# Patient Record
Sex: Male | Born: 1980 | Race: Black or African American | Hispanic: No | Marital: Single
Health system: Midwestern US, Community
[De-identification: ages and names within clinical notes are randomized; demographics above are authoritative.]

## PROBLEM LIST (undated history)

## (undated) DIAGNOSIS — R079 Chest pain, unspecified: Secondary | ICD-10-CM

## (undated) DIAGNOSIS — I10 Essential (primary) hypertension: Secondary | ICD-10-CM

## (undated) DIAGNOSIS — J309 Allergic rhinitis, unspecified: Secondary | ICD-10-CM

## (undated) DIAGNOSIS — L74519 Primary focal hyperhidrosis, unspecified: Secondary | ICD-10-CM

## (undated) DIAGNOSIS — J3489 Other specified disorders of nose and nasal sinuses: Secondary | ICD-10-CM

## (undated) DIAGNOSIS — K649 Unspecified hemorrhoids: Secondary | ICD-10-CM

## (undated) DIAGNOSIS — B009 Herpesviral infection, unspecified: Secondary | ICD-10-CM

## (undated) DIAGNOSIS — R3129 Other microscopic hematuria: Secondary | ICD-10-CM

## (undated) DIAGNOSIS — A539 Syphilis, unspecified: Secondary | ICD-10-CM

## (undated) DIAGNOSIS — Z01818 Encounter for other preprocedural examination: Secondary | ICD-10-CM

## (undated) HISTORY — DX: Chest pain, unspecified: R07.9

## (undated) MED ORDER — TRIAMCINOLONE ACETONIDE 0.5 % TOPICAL CREAM
0.5 % | Freq: Two times a day (BID) | CUTANEOUS | Status: DC
Start: ? — End: 2014-02-11

## (undated) MED ORDER — LORATADINE 10 MG TAB
10 mg | ORAL_TABLET | Freq: Every day | ORAL | Status: DC
Start: ? — End: 2014-02-11

## (undated) MED ORDER — LISINOPRIL-HYDROCHLOROTHIAZIDE 20 MG-12.5 MG TAB
ORAL_TABLET | Freq: Two times a day (BID) | ORAL | Status: DC
Start: ? — End: 2012-07-17

## (undated) MED ORDER — AZITHROMYCIN 500 MG TAB
500 mg | ORAL_TABLET | Freq: Every day | ORAL | Status: AC
Start: ? — End: 2012-07-18

## (undated) MED ORDER — LORATADINE 10 MG TAB
10 mg | ORAL_TABLET | Freq: Every day | ORAL | Status: DC
Start: ? — End: 2012-07-17

---

## 2010-12-26 MED ORDER — TRIAMCINOLONE ACETONIDE 0.5 % TOPICAL CREAM
0.5 % | Freq: Two times a day (BID) | CUTANEOUS | Status: DC
Start: 2010-12-26 — End: 2012-07-17

## 2010-12-26 MED ORDER — LISINOPRIL-HYDROCHLOROTHIAZIDE 20 MG-25 MG TAB
20-25 mg | ORAL_TABLET | Freq: Every day | ORAL | Status: DC
Start: 2010-12-26 — End: 2011-02-21

## 2010-12-26 NOTE — Progress Notes (Signed)
Mr. Delpilar presents today as a new patient.  He was previously followed by Dr. Lillia Corporal in [place], [place].  The patient reports having been seen approximately six months ago for upper respiratory illness, which was treated with, he believes, some antibiotics.  Since then, he has been taking some Mucinex over-the-counter but complains today of some persistent cough on and off since then.  The cough is unassociated with any sputum production or fever.  He denies any chest pain or shortness of breath.      The patient also complains of a rash to the neck as well as the posterior leg area.  He reports a childhood history of eczema but has not had this rash evaluated or treated recently.  He does report some associated itching.      Current medical problems include allergic rhinitis and gastroesophageal reflux.  He reports compliance with his medications.  However, he does report having been off of his blood pressure medications over the past three months as he believes he no longer needed the medication after having lost a significant amount of weight.      Review of systems is positive for some headaches.  He denies any sinus pain, chest pain, shortness of breath, abdominal pain, nausea, vomiting, fatigue or fevers.  No focal neurologic symptoms.    MedDATA/leh       Past Medical History   Diagnosis Date   ??? Hemorrhoids 07-2010   ??? GERD (gastroesophageal reflux disease)    ??? Hypertension    ??? Headache 2010   ??? Contact dermatitis and other eczema, due to unspecified cause    ??? Chronic allergic rhinitis      Family History   Problem Relation Age of Onset   ??? Headache Mother    ??? Hypertension Mother    ??? Heart Disease Father    ??? Hypertension Father    ??? Heart Attack Father    ??? Heart Disease Paternal Grandfather        History   Substance Use Topics   ??? Smoking status: Never Smoker    ??? Smokeless tobacco: Never Used   ??? Alcohol Use: Yes      rare      BP 158/76   Pulse 78   Temp(Src) 97.8 ??F (36.6 ??C) (Oral)   Resp 20   Ht 5' 6.5" (1.689 m)   Wt 174 lb (78.926 kg)   BMI 27.66 kg/m2    .Well nourished, not distressed  HEENT: no mucosal edema/inflammation  Neck: Supple without lymphadenopathy.   Lungs: CTA bilaterally with normal effort  CV: nl S1S2 no audible M/G/R; no pedal edema  Ab: non-distended, non-tender   derm: large hyperpigmented patches at lateral posterior base of neck and popliteal fossa  Moderate hyperpigmented macules <0.5 cm of the the back    Alert and oriented CN II-XII intact; no focal deficits   Nl affect and behavior      [place] was seen today for establish care, rash, headache and cough.    Diagnoses and associated orders for this visit:    Eczema  - triamcinolone (ARISTOCORT) 0.5 % topical cream; Apply  to affected area two (2) times a day. use thin layer  - REFERRAL TO DERMATOLOGY    Chronic cough  - XR CHEST PA LAT; Future  - SPECIMEN HANDLING,DR OFF->LAB  - COLLECTION VENOUS BLOOD,VENIPUNCTURE  - CBC WITH AUTOMATED DIFF  - HIV 1/O/2 AB WITH CONFIRMATION    Allergic rhinitis, cause unspecified,  The current medical regimen is effective;  continue present plan and medications.     Gerd (gastroesophageal reflux disease), The current medical regimen is effective;  continue present plan and medications.     Essential hypertension, uncontrolled off medicaiton  - Resume lisinopril-hydrochlorothiazide (PRINZIDE, ZESTORETIC) 20-25 mg per tablet; Take 1 Tab by mouth daily. Indications: HYPERTENSION  - METABOLIC PANEL, BASIC

## 2010-12-26 NOTE — Patient Instructions (Addendum)
Chest X-Rays: About These Tests  What is it?  A chest X-ray is a picture of the chest that shows your heart, lungs, airway, blood vessels, and lymph nodes. Chest X-rays can also show the bones of your spine and chest.  Why is this test done?  A chest X-ray is done to find problems with the organs and structures inside the chest.  How can you prepare for the test?  ?? Tell your doctor if you are or might be pregnant. A chest X-ray is usually not done during pregnancy, but the chance of harm to the fetus is very small. If you need a chest X-ray, you will wear a lead apron to help protect your baby.   What happens before the test?  ?? Remove any jewelry that might get in the way of the X-ray picture.   ?? You may need to take off all or most of your clothes above the waist. You will be given a gown to wear during the test.   What happens during the test?  Two X-ray views of the chest are usually taken. One view is taken from the back. The other view is taken from the side.  ?? You stand with your chest against an X-ray plate for the pictures.   ?? You will need to hold very still while the X-ray is taken. You may be asked to hold your breath for a few seconds.   What else should you know about the test?  ?? You won't feel any pain from the chest X-ray itself.   ?? If you have pain from a chest problem, you may feel some discomfort if you need to hold a certain position, breathe deep, or hold your breath while the X-ray is done.   How long does the test take?  ?? The test will take about 10 minutes.   What happens after the test?  ?? You will probably be able to go home right away. The results of a chest X-ray are usually available in 1 to 2 days.   ?? You can go back to your usual activities right away.   When should you call for help?  Watch closely for changes in your health, and be sure to contact your doctor if you have any questions about the test.   Follow-up care is a key part of your treatment and safety. Be sure to make and go to all appointments, and call your doctor if you are having problems. It's also a good idea to keep a list of the medicines you take. Ask your doctor when you can expect to have your test results.    Where can you learn more?    Go to [facility]   Enter [phone] in the search box to learn more about "Chest X-Rays: About These Tests."    ?? 2006-2012 [employer]. Care instructions adapted under license by [employer] (which disclaims liability or warranty for this information). This care instruction is for use with your licensed healthcare professional. If you have questions about a medical condition or this instruction, always ask your healthcare professional. [employer], Incorporated disclaims any warranty or liability for your use of this information.  Content Version: 9.2.102713; Last Revised: April 26, 2009    HIV Testing: After Your Visit  Your Care Instructions  You can get tested for the human immunodeficiency virus (HIV). This test checks for HIV antibodies in the blood. If HIV antibodies are found, the test is considered positive. If  HIV antibodies are not found, you may need to be tested again. This is done to make sure HIV antibodies do not appear at a later time, because it can take up to 6 months for antibodies to develop after a person is first exposed to the virus. Testing is often done at 6 weeks, 3 months, and 6 months after exposure. During this period, an infected person can still spread the infection even though his or her test was negative.  After initial testing, it is important for your doctor to contact you with the results of your test. Be sure to tell your doctor how and where to contact you. If your doctor has not contacted you within 1 to 2 weeks after your test, call and ask for your results.   Follow-up care is a key part of your treatment and safety. Be sure to make and go to all appointments, and call your doctor if you are having problems. It???s also a good idea to know your test results and keep a list of the medicines you take.  What do the results mean?  Regardless of the outcome of your test, your doctor may ask you to return to discuss your results. This does not mean that you have HIV.  Normal result  ?? A normal result means that no HIV antibodies were found in your blood. Normal results are called negative.   ?? Since most infected people have antibodies to HIV within 6 months after their exposure, you may need a repeat test to be sure the results are correct. If a repeat test at 6 months after your last exposure is negative, there is no infection.   Indeterminate result  ?? A test result that does not clearly show whether you have an HIV infection is usually called an indeterminate result. It may happen before HIV antibodies develop or when some other type of antibody is interfering with the results. If this occurs, another test will usually be done right away.   Abnormal result  ?? An abnormal result means that you have HIV antibodies in your blood. These results are called positive.   ?? If your test result is positive, you will get counseling on how to handle the results and what to do next.   ?? A positive test is repeated on the same blood sample. If two or more results are positive, they must be confirmed by another type of test. This is because some tests can cause false-positive results. No one is considered HIV-positive until he or she has a positive Western blot, IFA, or PCR test, which are used to confirm other test results.    ?? If you have a positive test result, contact your sex partners to tell them. They should be tested. You may be able to get help from your local Health Department in contacting your sex partners. In many places, a Health Department employee will contact you to offer this help.     Where can you learn more?    Go to [facility]   Enter [phone] in the search box to learn more about "HIV Testing: After Your Visit."    ?? 2006-2012 Healthwise, Inc[employer]. Care instructions adapted under license by [employer] (which disclaims liability or warranty for this information). This care instruction is for use with your licensed healthcare professional. If you have questions about a medical condition or this instruction, always ask your healthcare professional. Healthwise, Incorporated disclaims any warranty or liability for your use of this  information.  Content Version: 9.2.102713; Last Revised: September 16, 2008

## 2010-12-27 LAB — HIV 1/O/2 AB WITH CONFIRMATION
HIV 1/O/2 Abs, QL: NONREACTIVE
HIV 1/O/2 Abs, Qual: NONREACTIVE
HIV 1/O/2 Abs-Index Value: <1 (ref <1.00)
HIV 1/O/2 Abs: <1 (ref <1.00)

## 2010-12-27 LAB — CBC WITH AUTOMATED DIFF
ABS. BASOPHILS: 0 10*3/uL (ref 0.0–0.2)
ABS. EOSINOPHILS: 0.2 10*3/uL (ref 0.0–0.4)
ABS. IMM. GRANS.: 0 10*3/uL (ref 0.0–0.1)
ABS. LYMPHOCYTES: 1.5 10*3/uL (ref 0.7–4.5)
ABS. MONOCYTES: 0.4 10*3/uL (ref 0.1–1.0)
ABS. NEUTROPHILS: 6.2 10*3/uL (ref 1.8–7.8)
BASOPHILS: 0 % (ref 0–3)
EOSINOPHILS: 3 % (ref 0–7)
HCT: 42.3 % (ref 36.0–50.0)
HGB: 13.9 g/dL (ref 12.5–17.0)
IMMATURE GRANULOCYTES: 0 % (ref 0–2)
LYMPHOCYTES: 18 % (ref 14–46)
MCH: 29.3 pg (ref 27.0–34.0)
MCHC: 32.9 g/dL (ref 32.0–36.0)
MCV: 89 fL (ref 80–98)
MONOCYTES: 5 % (ref 4–13)
NEUTROPHILS: 74 % (ref 40–74)
PLATELET: 348 10*3/uL (ref 140–415)
RBC: 4.74 x10E6/uL (ref 4.10–5.60)
RDW: 12.9 % (ref 11.7–15.0)
WBC: 8.4 10*3/uL (ref 4.0–10.5)

## 2011-02-21 ENCOUNTER — Encounter

## 2011-02-21 MED ORDER — LISINOPRIL-HYDROCHLOROTHIAZIDE 20 MG-25 MG TAB
20-25 mg | ORAL_TABLET | Freq: Every day | ORAL | Status: DC
Start: 2011-02-21 — End: 2011-03-21

## 2011-02-21 NOTE — Telephone Encounter (Signed)
Message copied by Farrel Gobble on Tue Feb 21, 2011  3:03 PM  ------       Message from: Vangie Bicker       Created: Tue Feb 21, 2011  2:26 PM       Regarding: hill         #[phone] refill on Lisinopril but states he has to speak with you first [place].  Pt states he moved. (?)

## 2011-03-21 ENCOUNTER — Encounter

## 2011-03-21 MED ORDER — LISINOPRIL-HYDROCHLOROTHIAZIDE 20 MG-25 MG TAB
20-25 mg | ORAL_TABLET | Freq: Every day | ORAL | Status: DC
Start: 2011-03-21 — End: 2011-08-25

## 2011-03-21 MED ORDER — RANITIDINE 150 MG TAB
150 mg | ORAL_TABLET | Freq: Every day | ORAL | Status: DC
Start: 2011-03-21 — End: 2011-08-25

## 2011-03-21 MED ORDER — LORATADINE-PSEUDOEPHEDRINE SR 10 MG-240 MG 24 HR TAB
10-240 mg | ORAL_TABLET | Freq: Every day | ORAL | Status: DC
Start: 2011-03-21 — End: 2011-07-18

## 2011-03-21 NOTE — Telephone Encounter (Signed)
Pt only stated he needed bp, allergy and stomach medications refilled.  I believe I have the correct ones. He didn't know names off hand.      PLEASE USE [facility] [address]

## 2011-07-18 ENCOUNTER — Encounter

## 2011-07-18 MED ORDER — LORATADINE-PSEUDOEPHEDRINE SR 10 MG-240 MG 24 HR TAB
10-240 mg | ORAL_TABLET | Freq: Every day | ORAL | Status: DC
Start: 2011-07-18 — End: 2014-02-11

## 2011-07-18 NOTE — Telephone Encounter (Signed)
ML please call prior to rx rf.

## 2011-07-18 NOTE — Telephone Encounter (Signed)
Pt transferred to set up appt with Dr. Hill.    Signed and sent to pharmacy per verbal order Dr. Hill.

## 2011-08-25 MED ORDER — RANITIDINE 150 MG TAB
150 mg | ORAL_TABLET | Freq: Every day | ORAL | Status: DC | PRN
Start: 2011-08-25 — End: 2012-07-17

## 2011-08-25 MED ORDER — LORATADINE 10 MG TAB
10 mg | ORAL_TABLET | Freq: Every day | ORAL | Status: DC
Start: 2011-08-25 — End: 2012-05-22

## 2011-08-25 MED ORDER — LISINOPRIL-HYDROCHLOROTHIAZIDE 20 MG-12.5 MG TAB
ORAL_TABLET | Freq: Two times a day (BID) | ORAL | Status: DC
Start: 2011-08-25 — End: 2012-05-22

## 2011-08-25 MED ORDER — FLUTICASONE 50 MCG/ACTUATION NASAL SPRAY, SUSP
50 mcg/actuation | Freq: Two times a day (BID) | NASAL | Status: DC | PRN
Start: 2011-08-25 — End: 2014-02-11

## 2011-08-25 NOTE — Progress Notes (Signed)
Levi [name] presents today in follow-up of chronic medical problems including hypertension, allergic rhinitis, and gastroesophageal reflux.  He reports _____ sinus symptoms but only occasional reflux symptoms.  He reports compliance with his blood pressure medication and he denies any heavy alcohol or caffeine intake.  He does not exercise regularly but is making plans to do so.  He reports a significant amount of weight gain this year however he denies any heat or cold intolerance, any fatigue or constipation.    Review of systems is otherwise negative for any headaches, dizziness, any shortness of breath, any abdominal symptoms, leg swelling.      MedDATA/jah       Past Medical History   Diagnosis Date   ??? Hemorrhoids 07-2010   ??? GERD (gastroesophageal reflux disease)    ??? Hypertension    ??? Headache 2010   ??? Contact dermatitis and other eczema, due to unspecified cause    ??? Chronic allergic rhinitis      History   Substance Use Topics   ??? Smoking status: Never Smoker    ??? Smokeless tobacco: Never Used   ??? Alcohol Use: Yes      rare     Current Outpatient Prescriptions on File Prior to Visit   Medication Sig Dispense Refill   ??? loratadine-pseudoephedrine (CLARITIN-D 24-HOUR) 10-240 mg per tablet Take 1 Tab by mouth daily. Indications: ALLERGIC RHINITIS  30 Tab  0   ??? DISCONTD: lisinopril-hydrochlorothiazide (PRINZIDE, ZESTORETIC) 20-25 mg per tablet Take 1 Tab by mouth daily. Indications: HYPERTENSION  30 Tab  1   ??? triamcinolone (ARISTOCORT) 0.5 % topical cream Apply  to affected area two (2) times a day. use thin layer  60 g  1     BP 122/98   Pulse 85   Resp 18   Ht 5' 6.5" (1.689 m)   Wt 194 lb (87.998 kg)   BMI 30.84 kg/m2   SpO2 99%  Well nourished, not distressed  HEENT: no mucosal edema/inflammation  Neck: No carotid bruit No thyromegaly   >2 patellar dtr's   Lungs: CTA bilaterally with normal effort  CV: nl S1S2 no audible M/G/R;   No BLE edema  Ab: non-distended, non-tender      April normal cbc     Levi [name] was seen today for medication refill.    Diagnoses and associated orders for this visit:    Hypertension, uncontrolled  - PR SPECIMEN HANDLING,DR OFF->LAB  - PR COLLECTION VENOUS BLOOD,VENIPUNCTURE  - METABOLIC PANEL, BASIC  - TSH, 3RD GENERATION  - Increase lisinopril-hydrochlorothiazide (PRINZIDE, ZESTORETIC) 20-12.5 mg per tablet; Take 1 Tab by mouth two (2) times a day.  - LIPID PANEL    Gerd (gastroesophageal reflux disease)  - wean to prn: ranitidine (ZANTAC) 150 mg tablet; Take 1 Tab by mouth daily as needed for Indigestion. Indications: GASTROESOPHAGEAL REFLUX    Chronic allergic rhinitis, The current medical regimen is effective;  continue present plan and medications.   - loratadine (CLARITIN) 10 mg tablet; Take 1 Tab by mouth daily.  - fluticasone (FLONASE) 50 mcg/actuation nasal spray; 1 Spray by Both Nostrils route two (2) times daily as needed for Rhinitis. Indications: CHRONIC NON-ALLERGIC RHINITIS    Weight gain  - TSH, 3RD GENERATION  - Recommended lifestyle and dietary modifications and reviewed benefits of exercise 3-4 x weekly for at least one half hour and weight loss.

## 2011-08-25 NOTE — Progress Notes (Signed)
Chief Complaint   Patient presents with   ??? Medication Refill     "REVIEWED RECORD IN PREPARATION FOR VISIT AND HAVE OBTAINED THE NECESSARY DOCUMENTATION"    Patient refuses flu shot.

## 2011-08-26 LAB — METABOLIC PANEL, BASIC
BUN/Creatinine ratio: 11 (ref 8–19)
BUN: 12 mg/dL (ref 6–20)
CO2: 24 mmol/L (ref 20–32)
Calcium: 9.9 mg/dL (ref 8.7–10.2)
Chloride: 101 mmol/L (ref 97–108)
Creatinine: 1.11 mg/dL (ref 0.76–1.27)
GFR est non-AA: 87 mL/min/{1.73_m2} (ref >59)
Glucose: 86 mg/dL (ref 65–99)
Potassium: 4.4 mmol/L (ref 3.5–5.2)
Sodium: 141 mmol/L (ref 134–144)
eGFR If African American: 101 mL/min/{1.73_m2} (ref >59)

## 2011-08-26 LAB — LIPID PANEL
Cholesterol, total: 207 mg/dL — ABNORMAL HIGH (ref 100–199)
HDL Cholesterol: 39 mg/dL — ABNORMAL LOW (ref >39)
LDL, calculated: 148 mg/dL — ABNORMAL HIGH (ref 0–99)
Triglyceride: 101 mg/dL (ref 0–149)
VLDL, calculated: 20 mg/dL (ref 5–40)

## 2011-08-26 LAB — CVD REPORT: PDF IMAGE: 0

## 2011-08-26 LAB — TSH 3RD GENERATION: TSH: 1.85 u[IU]/mL (ref 0.450–4.500)

## 2011-10-02 NOTE — Progress Notes (Signed)
Chief Complaint   Patient presents with   ??? Hospital Follow Up     pt. was seen at [facility] er and would like to follow up on asthma and low bp     "Reviewed record in preparation for visit and have obtained the necessary documentation."

## 2011-10-02 NOTE — Progress Notes (Signed)
Levi [name] presents today after follow-up from the emergency department yesterday.  After being diagnosed with upper respiratory illness and dehydration he was treated with a Z-Pak, Hydrocodone for pain, Phenergan for nausea and Motrin.  He reports having been given a breathing treatment as well.  He was evaluated and treated at [facility] Emergency Department.  He reports that on Saturday, two days ago he developed some cough with yellow phlegm that has been causing some back achiness but he denies any general body aches.  He reports some decreased sleep, feeling tired, initial sore throat which has resolved.  He denies any dizziness, any vomiting.  He does report decreased appetite and decreased oral intake but is tolerating the liquids.  He denies any abdominal pain or diarrhea.  He denies any chest pain, any wheezing or shortness of breath.  He has no previous history of asthma.      MedDATA/jah       Past Medical History   Diagnosis Date   ??? Hemorrhoids 07-2010   ??? GERD (gastroesophageal reflux disease)    ??? Hypertension    ??? Headache 2010   ??? Contact dermatitis and other eczema, due to unspecified cause    ??? Chronic allergic rhinitis    ??? Asthma      History   Substance Use Topics   ??? Smoking status: Never Smoker    ??? Smokeless tobacco: Never Used   ??? Alcohol Use: Yes      rare     Current Outpatient Prescriptions on File Prior to Visit   Medication Sig Dispense Refill   ??? loratadine (CLARITIN) 10 mg tablet Take 1 Tab by mouth daily.  30 Tab  3   ??? ranitidine (ZANTAC) 150 mg tablet Take 1 Tab by mouth daily as needed for Indigestion. Indications: GASTROESOPHAGEAL REFLUX  30 Tab  3   ??? lisinopril-hydrochlorothiazide (PRINZIDE, ZESTORETIC) 20-12.5 mg per tablet Take 1 Tab by mouth two (2) times a day.  60 Tab  3   ??? loratadine-pseudoephedrine (CLARITIN-D 24-HOUR) 10-240 mg per tablet Take 1 Tab by mouth daily. Indications: ALLERGIC RHINITIS  30 Tab  0   ??? fluticasone (FLONASE) 50 mcg/actuation nasal spray 1  Spray by Both Nostrils route two (2) times daily as needed for Rhinitis. Indications: CHRONIC NON-ALLERGIC RHINITIS  1 Bottle  5   ??? triamcinolone (ARISTOCORT) 0.5 % topical cream Apply  to affected area two (2) times a day. use thin layer  60 g  1     BP 124/90   Pulse 94   Temp(Src) 98.5 ??F (36.9 ??C) (Oral)   Resp 16   Ht 5' 6.5" (1.689 m)   Wt 193 lb 12.8 oz (87.907 kg)   BMI 30.81 kg/m2   SpO2 98%  Well nourished, not distressed; appears tired  Cough noted  HEENT: mild nasal mucosal edema/inflammation;   no tonsillar exudates  Neck: Supple without lymphadenopathy.   Lungs: CTA bilaterally with normal effort  no wheezing/rales/ or rhonchi   CV: nl S1S2           [place] was seen today for hospital follow up.    Diagnoses and associated orders for this visit:    Acute uri, continue current regimen as detailed above  Recommended symptomatic treatment, increased fluids and rest. RTC or call if symptoms worsen or persist.   recommended Muccinex DM or equivalent for cough and chest congestion prn     Sore throat  - AMB POC RAPID INFLUENZA TEST--neg  -  OTC NSAID prn with food

## 2011-10-03 LAB — AMB POC RAPID INFLUENZA TEST: QuickVue Influenza test: NEGATIVE

## 2012-05-22 ENCOUNTER — Encounter

## 2012-05-22 NOTE — Telephone Encounter (Signed)
From: Eisenhuth,Jamorris   To: Inez Pilgrim, MD   Sent: Wed May 22, 2012 11:34 AM   Subject: Medication Renewal Request    Original authorizing provider: Inez Pilgrim, MD    Levi [name] would like a refill of the following medications:  loratadine (CLARITIN) 10 mg tablet Inez Pilgrim, MD]  lisinopril-hydrochlorothiazide (PRINZIDE, ZESTORETIC) 20-12.5 mg per tablet Inez Pilgrim, MD]    Preferred pharmacy: [address] [place] [address] [facility]    Comment:  Can you have sent [facility] pharmacy [facility] is [facility]  [address] [place] [address]

## 2012-05-24 NOTE — Telephone Encounter (Signed)
rx's phoned into [facility]

## 2012-07-17 LAB — AMB POC URINALYSIS DIP STICK AUTO W/O MICRO
Bilirubin (UA POC): NEGATIVE
Glucose (UA POC): NEGATIVE
Ketones (UA POC): NEGATIVE
Leukocyte esterase (UA POC): NEGATIVE
Nitrites (UA POC): NEGATIVE
Specific gravity (UA POC): 1.02 (ref 1.001–1.035)
Urobilinogen (UA POC): 0.2 (ref 0.2–1)
pH (UA POC): 7 (ref 4.6–8.0)

## 2012-07-17 NOTE — Progress Notes (Signed)
Chief Complaint   Patient presents with   ??? Blood Pressure Check   ??? Other     HIV/STD      "REVIEWED RECORD IN PREPARATION FOR VISIT AND HAVE OBTAINED THE NECESSARY DOCUMENTATION"    Component      Latest Ref Rng 10/02/2011 08/25/2011 08/25/2011 08/25/2011           8:02 PM 11:23 AM 11:23 AM 11:23 AM   WBC      4.0 - 10.5 x10E3/uL       RBC      4.10 - 5.60 x10E6/uL       HGB      12.5 - 17.0 g/dL       HCT      16.1 - 09.6 %       MCV      80 - 98 fL       MCH      27.0 - 34.0 pg       MCHC      32.0 - 36.0 g/dL       RDW      04.5 - 40.9 %       PLATELET      140 - 415 x10E3/uL       NEUTROPHILS      40 - 74 %       LYMPHOCYTES      14 - 46 %       MONOCYTES      4 - 13 %       EOSINOPHILS      0 - 7 %       BASOPHILS      0 - 3 %       ABS. NEUTROPHILS      1.8 - 7.8 x10E3/uL       ABS. LYMPHOCYTES      0.7 - 4.5 x10E3/uL       ABS. MONOCYTES      0.1 - 1.0 x10E3/uL       ABS. EOSINOPHILS      0.0 - 0.4 x10E3/uL       ABS. BASOPHILS      0.0 - 0.2 x10E3/uL       IMMATURE GRANULOCYTES      0 - 2 %       ABS. IMM. GRANS.      0.0 - 0.1 x10E3/uL       Glucose      65 - 99 mg/dL  86     BUN      6 - 20 mg/dL  12     Creatinine      0.76 - 1.27 mg/dL  8.11     GFR est non-AA      >59 mL/min/1.73  87     eGFR If Africn Am      >59 mL/min/1.73  101     BUN/Creatinine ratio      8 - 19  11     Sodium      134 - 144 mmol/L  141     Potassium      3.5 - 5.2 mmol/L  4.4     Chloride      97 - 108 mmol/L  101     CO2      20 - 32 mmol/L  24     Calcium      8.7 - 10.2 mg/dL  9.9     Cholesterol, total  100 - 199 mg/dL    161 (H)   Triglyceride      0 - 149 mg/dL    096   HDL Cholesterol      >39 mg/dL    39 (L)   VLDL, calculated      5 - 40 mg/dL    20   LDL, calculated      0 - 99 mg/dL    045 (H)   INTERPRETATION             PDF IMAGE             TSH      0.450 - 4.500 uIU/mL   1.850    QuickVue Influenza test      Negative Negative        Component      Latest Ref Rng 08/25/2011 12/26/2010          11:23 AM 12:38 PM    WBC      4.0 - 10.5 x10E3/uL  8.4   RBC      4.10 - 5.60 x10E6/uL  4.74   HGB      12.5 - 17.0 g/dL  40.9   HCT      81.1 - 50.0 %  42.3   MCV      80 - 98 fL  89   MCH      27.0 - 34.0 pg  29.3   MCHC      32.0 - 36.0 g/dL  91.4   RDW      78.2 - 15.0 %  12.9   PLATELET      140 - 415 x10E3/uL  348   NEUTROPHILS      40 - 74 %  74   LYMPHOCYTES      14 - 46 %  18   MONOCYTES      4 - 13 %  5   EOSINOPHILS      0 - 7 %  3   BASOPHILS      0 - 3 %  0   ABS. NEUTROPHILS      1.8 - 7.8 x10E3/uL  6.2   ABS. LYMPHOCYTES      0.7 - 4.5 x10E3/uL  1.5   ABS. MONOCYTES      0.1 - 1.0 x10E3/uL  0.4   ABS. EOSINOPHILS      0.0 - 0.4 x10E3/uL  0.2   ABS. BASOPHILS      0.0 - 0.2 x10E3/uL  0.0   IMMATURE GRANULOCYTES      0 - 2 %  0   ABS. IMM. GRANS.      0.0 - 0.1 x10E3/uL  0.0   Glucose      65 - 99 mg/dL     BUN      6 - 20 mg/dL     Creatinine      9.56 - 1.27 mg/dL     GFR est non-AA      >59 mL/min/1.73     eGFR If Africn Am      >59 mL/min/1.73     BUN/Creatinine ratio      8 - 19     Sodium      134 - 144 mmol/L     Potassium      3.5 - 5.2 mmol/L     Chloride      97 - 108 mmol/L     CO2  20 - 32 mmol/L     Calcium      8.7 - 10.2 mg/dL     Cholesterol, total      100 - 199 mg/dL     Triglyceride      0 - 149 mg/dL     HDL Cholesterol      >39 mg/dL     VLDL, calculated      5 - 40 mg/dL     LDL, calculated      0 - 99 mg/dL     INTERPRETATION       Note    PDF IMAGE       .    TSH      0.450 - 4.500 uIU/mL     QuickVue Influenza test      Negative

## 2012-07-17 NOTE — Progress Notes (Signed)
Levi [name] presents today for evaluation of his probably and with complaints of a penile discharge.  He has a history of.hypertension that has been treated with medication, however, he has only been taking medication sparingly.  He reports a 30 pound weight loss intentionally through dieting and exercise over the past several months and did not feel that he needed to take the blood pressure medication.  He also reports that hs reflux has been controlled without his Zantac.      He reports also to have a penile pussy white discharge approximately two weeks ago that self resolved after a week.  He has a previous history of STD, which he cannot recall the exact diagnosis of, but one that he was treated for years ago.  The patient current reports a single sexual partner over the past six months.  He does have a history of bisexual activity.  At this time he reports that he has not had any recurrent discharge after it resolved after a week, and he denies any current dysuria, hematuria, back pain, abdominal pain, nausea, vomiting or fever.  He also denies any penile rashes, lesions or any swelling.  He denies any fever, joint pain or fatigue.     MedDATA/jtm      Past Medical History   Diagnosis Date   ??? Hemorrhoids 07-2010   ??? GERD (gastroesophageal reflux disease)    ??? Hypertension    ??? Headache 2010   ??? Contact dermatitis and other eczema, due to unspecified cause    ??? Chronic allergic rhinitis    ??? Asthma      History   Substance Use Topics   ??? Smoking status: Never Smoker    ??? Smokeless tobacco: Never Used   ??? Alcohol Use: Yes      Comment: rare     Current Outpatient Prescriptions on File Prior to Visit   Medication Sig Dispense Refill   ??? fluticasone (FLONASE) 50 mcg/actuation nasal spray 1 Spray by Both Nostrils route two (2) times daily as needed for Rhinitis. Indications: CHRONIC NON-ALLERGIC RHINITIS  1 Bottle  5   ??? loratadine-pseudoephedrine (CLARITIN-D 24-HOUR) 10-240 mg per tablet Take 1 Tab by mouth  daily. Indications: ALLERGIC RHINITIS  30 Tab  0     No current facility-administered medications on file prior to visit.     BP 122/68   Pulse 94   Temp(Src) 98 ??F (36.7 ??C) (Oral)   Resp 18   Ht 5' 6.5" (1.689 m)   Wt 165 lb (74.844 kg)   BMI 26.24 kg/m2   SpO2 97%  Well nourished, not distressed  Lungs: CTA bilaterally with normal effort  CV: nl S1S2 no audible M/G/R;   Ab: non-distended, non-tender        See attached lab results           Levi [name] was seen today for blood pressure check and other.    Diagnoses and associated orders for this visit:    Penile discharge  - AMB POC URINALYSIS DIP STICK AUTO W/O MICRO--abnl  - CHLAMYDIA/GC NAA, CONFIRMATION  - HIV 1/O/2 AB WITH REFLEX NAT  - azithromycin (ZITHROMAX) 500 mg tablet; Take 2 Tabs by mouth daily for 1 day.  - Counseled regarding STD prevention     Gerd and Hypertension, controlled off meds following 30# loss  - METABOLIC PANEL, BASIC    Eczema  - triamcinolone (ARISTOCORT) 0.5 % topical cream; Apply  to affected area two (2) times a day. use  thin layer    Chronic allergic rhinitis  - loratadine (CLARITIN) 10 mg tablet; Take 1 Tab by mouth daily.    Other Orders  - Cancel: lisinopril-hydrochlorothiazide (PRINZIDE, ZESTORETIC) 20-12.5 mg per tablet; Take 1 Tab by mouth two (2) times a day.  - Cancel: ranitidine (ZANTAC) 150 mg tablet; Take 1 Tab by mouth daily as needed for Indigestion. Indications: GASTROESOPHAGEAL REFLUX

## 2012-07-18 LAB — HIV 1/O/2 AB WITH REFLEX NAT
HIV 1/O/2 ABS, QUAL, 001726: NONREACTIVE
HIV 1/O/2 ABS-INDEX VALUE, 150035: <1 (ref <1.00)
HIV 1/O/2 Abs, Qual: NONREACTIVE
HIV 1/O/2 Abs-Index Value: <1 (ref <1.00)

## 2012-07-18 LAB — METABOLIC PANEL, BASIC
BUN/Creatinine ratio: 7 — ABNORMAL LOW (ref 8–19)
BUN: 8 mg/dL (ref 6–20)
CO2: 27 mmol/L (ref 19–28)
Calcium: 9.8 mg/dL (ref 8.7–10.2)
Chloride: 104 mmol/L (ref 97–108)
Creatinine: 1.07 mg/dL (ref 0.76–1.27)
GFR est AA: 105 mL/min/{1.73_m2} (ref >59)
GFR est non-AA: 91 mL/min/{1.73_m2} (ref >59)
Glucose: 80 mg/dL (ref 65–99)
Potassium: 4 mmol/L (ref 3.5–5.2)
Sodium: 142 mmol/L (ref 134–144)

## 2012-07-19 LAB — CHLAMYDIA/GC NAA, CONFIRMATION
Chlamydia trachomatis, NAA: NEGATIVE
Neisseria gonorrhoeae, NAA: NEGATIVE

## 2012-07-22 NOTE — Telephone Encounter (Addendum)
Message copied by Elon Spanner on Mon Jul 22, 2012  3:42 PM  ------       Message from: Luanne Bras       Created: Mon Jul 22, 2012  2:43 PM       Regarding: Test Results Question       Contact: [phone]         Hello,              I wanted to follow up with my lab work that I had done last week. I still haven't heard back and wanted to check the status and see if they are in yet.  ------sent email that everything came back fine.  Will check and see if Dr Loleta Chance needs to f/u with you.

## 2014-02-09 ENCOUNTER — Encounter

## 2014-02-09 LAB — AMB POC URINALYSIS DIP STICK AUTO W/ MICRO (MICRO RESULTS)
Bacteria (UA POC): 0
Crystals (UA POC): 0
Epithelial cells (UA POC): 0
Glucose (UA POC): NEGATIVE
Leukocyte esterase (UA POC): NEGATIVE
Nitrites (UA POC): NEGATIVE
Other (UA POC): 0
Specific gravity (UA POC): 1.03 (ref 1.001–1.035)
Urobilinogen (UA POC): 0.2 (ref 0.2–1)
WBCs (UA POC): 0
pH (UA POC): 6 (ref 4.6–8.0)

## 2014-02-09 LAB — METABOLIC PANEL, COMPREHENSIVE
A-G Ratio: 1.5 (ref 0.8–1.7)
ALT (SGPT): 21 U/L (ref 16–61)
AST (SGOT): 11 U/L — ABNORMAL LOW (ref 15–37)
Albumin: 4.5 g/dL (ref 3.4–5.0)
Alk. phosphatase: 59 U/L (ref 45–117)
Anion gap: 6 mmol/L (ref 3.0–18)
BUN/Creatinine ratio: 9 — ABNORMAL LOW (ref 12–20)
BUN: 10 MG/DL (ref 7.0–18)
Bilirubin, total: 0.4 MG/DL (ref 0.2–1.0)
CO2: 31 mmol/L (ref 21–32)
Calcium: 9.3 MG/DL (ref 8.5–10.1)
Chloride: 106 mmol/L (ref 100–108)
Creatinine: 1.17 MG/DL (ref 0.6–1.3)
GFR est AA: >60 mL/min/{1.73_m2} (ref >60)
GFR est non-AA: >60 mL/min/{1.73_m2} (ref >60)
Globulin: 3 g/dL (ref 2.0–4.0)
Glucose: 87 mg/dL (ref 74–99)
Potassium: 3.8 mmol/L (ref 3.5–5.5)
Protein, total: 7.5 g/dL (ref 6.4–8.2)
Sodium: 143 mmol/L (ref 136–145)

## 2014-02-09 LAB — CBC W/O DIFF
HCT: 39.6 % (ref 36.0–48.0)
HGB: 13.2 g/dL (ref 13.0–16.0)
MCH: 30.9 PG (ref 24.0–34.0)
MCHC: 33.3 g/dL (ref 31.0–37.0)
MCV: 92.7 FL (ref 74.0–97.0)
MPV: 10.5 FL (ref 9.2–11.8)
PLATELET: 291 10*3/uL (ref 135–420)
RBC: 4.27 M/uL — ABNORMAL LOW (ref 4.70–5.50)
RDW: 12.8 % (ref 11.6–14.5)
WBC: 5.3 10*3/uL (ref 4.6–13.2)

## 2014-02-09 LAB — PROTHROMBIN TIME + INR
INR: 0.9 (ref 0.8–1.2)
Prothrombin time: 12.7 s (ref 11.5–15.2)

## 2014-02-09 LAB — AMB POC PVR, MEAS,POST-VOID RES,US,NON-IMAGING: PVR: 0 cc

## 2014-02-09 LAB — PTT: aPTT: 30.2 s (ref 24.6–37.7)

## 2014-02-09 NOTE — Progress Notes (Signed)
CC: Microscopic hematuria    HPI:       Levi [name] is a 33 y.o. male who was referred by Babs BertinATHERINE P KNIGHT, MD for evaluation of Microscopic hematuria.     The patient was sent for evaluation of microscopic hematuria, first seeing our practice in early June 2015.  [facility] indicated a urinalysis in November 2013 was 1+ blood on dipstick (no microscopic evaluation).  [facility] records showed a urinalysis in May 2014 with 3-5 red cells per high-power field, 1 in January 2015 with 10-20 red cells per high-power field, and one in March 2015 with 5-10 red cells per high-power field.  He had no upper tract imaging studies in the [facility] or [facility].   No gross heme.  A [facility] U/S 01/13/14 showed normal kidneys.    He has no personal or family history of urologic malignancy.           AUA Assessment Score:  AUA Score: 6;    AUA Bother Rating:        Past Medical History   Diagnosis Date   ??? Hemorrhoids 07-2010   ??? GERD (gastroesophageal reflux disease)    ??? Hypertension    ??? Headache(784.0) 2010   ??? Contact dermatitis and other eczema, due to unspecified cause    ??? Chronic allergic rhinitis    ??? Asthma        History reviewed. No pertinent past surgical history.     Family History   Problem Relation Age of Onset   ??? Headache Mother    ??? Hypertension Mother    ??? Heart Disease Father    ??? Hypertension Father    ??? Heart Attack Father    ??? Heart Disease Paternal Grandfather        Current Outpatient Prescriptions   Medication Sig Dispense Refill   ??? COMPLERA tablet        ??? lisinopril-hydrochlorothiazide (PRINZIDE, ZESTORETIC) 20-12.5 mg per tablet        ??? triamcinolone (ARISTOCORT) 0.5 % topical cream Apply  to affected area two (2) times a day. use thin layer  60 g  1   ??? loratadine (CLARITIN) 10 mg tablet Take 1 Tab by mouth daily.  30 Tab  3   ??? fluticasone (FLONASE) 50 mcg/actuation nasal spray 1 Spray by Both Nostrils route two (2) times daily as needed for Rhinitis. Indications:  CHRONIC NON-ALLERGIC RHINITIS  1 Bottle  5   ??? loratadine-pseudoephedrine (CLARITIN-D 24-HOUR) 10-240 mg per tablet Take 1 Tab by mouth daily. Indications: ALLERGIC RHINITIS  30 Tab  0       Allergies:  No Known Allergies      ROS:    Fever: Neg Shortness of Breath: Neg Rash: Neg Nausea/Vomiting: Neg Hearing Difficulty: Neg Back Pain: Neg Blurred Vision: Neg Weakness: Neg Chest Pain: Neg Memory Loss: Neg          PE:    Resp 12   Ht 5' 6.5" (1.689 m)   Wt 165 lb (74.844 kg)   BMI 26.24 kg/m2  Constitutional: WDWN, Pleasant and appropriate affect, No acute distress.    CV:  No peripheral swelling noted  Respiratory: No respiratory distress or difficulties  Abdomen:  No abdominal masses or tenderness.  No CVA tenderness. No hernias noted.   Skin: No jaundice.    Neuro/Psych:  Alert and oriented x 3. Affect appropriate.   Lymphatic:   No enlarged inguinal lymph nodes.  GU Male:    Prostate exam-declined, he prefers to be done under sedation when he has his procedure         LAB:    Results for orders placed in visit on 02/09/14   AMB POC PVR, MEAS,POST-VOID RES,US,NON-IMAGING       Result Value Ref Range    PVR 0     AMB POC URINALYSIS DIP STICK AUTO W/ MICRO (MICRO RESULTS)       Result Value Ref Range    Color (UA POC) Yellow      Clarity (UA POC) Clear      Glucose (UA POC) Negative  Negative    Bilirubin (UA POC) 2+  Negative    Ketones (UA POC) Trace  Negative    Specific gravity (UA POC) 1.030  1.001 - 1.035    Blood (UA POC) 3+  Negative    pH (UA POC) 6.0  4.6 - 8.0    Protein (UA POC) Trace  Negative mg/dL    Urobilinogen (UA POC) 0.2 mg/dL  0.2 - 1    Nitrites (UA POC) Negative  Negative    Leukocyte esterase (UA POC) Negative  Negative    Epithelial cells (UA POC) 0      WBCs (UA POC) 0      RBCs (UA POC) 8-10      Bacteria (UA POC) 0      Crystals (UA POC) 0  Negative    Other (UA POC) 0                      IMP:      1. Microscopic hematuria    2. Nocturia                    SUMMARY STATEMENT AND  PLAN:        The patient has persistent microscopic hematuria.  I recommend cystoscopy to complete his evaluation but given his age and anxiety, he wants it done under anesthesia.  By performing it under anesthesia, he would then give [name] the opportunity to perform retrograde pyelography and we will also do his rectal exam at that time.  I offered him the option of an office cystoscopy but he declined.  He understands the risks and accepts.  We will schedule him for outpatient cystoscopy, retrograde pyelography, fluoroscopy, and rectal exam.  He knows he will need to have somebody drive him home from the procedure.             PLEASE NOTE:   This document has been produced using voice recognition software. Unrecognized errors in transcription may be present    Claudius Sis, MD, F.A.C.S.

## 2014-02-09 NOTE — H&P (View-Only) (Signed)
CC: Microscopic hematuria    HPI:       Levi [name] is a 33 y.o. male who was referred by CATHERINE P KNIGHT, MD for evaluation of Microscopic hematuria.     The patient was sent for evaluation of microscopic hematuria, first seeing our practice in early June 2015.  [place] records indicated a urinalysis in November 2013 was 1+ blood on dipstick (no microscopic evaluation).  [facility] records showed a urinalysis in May 2014 with 3-5 red cells per high-power field, 1 in January 2015 with 10-20 red cells per high-power field, and one in March 2015 with 5-10 red cells per high-power field.  He had no upper tract imaging studies in the [facility] or [place] systems.   No gross heme.  A [facility] U/S 01/13/14 showed normal kidneys.    He has no personal or family history of urologic malignancy.           AUA Assessment Score:  AUA Score: 6;    AUA Bother Rating:        Past Medical History   Diagnosis Date   ??? Hemorrhoids 07-2010   ??? GERD (gastroesophageal reflux disease)    ??? Hypertension    ??? Headache(784.0) 2010   ??? Contact dermatitis and other eczema, due to unspecified cause    ??? Chronic allergic rhinitis    ??? Asthma        History reviewed. No pertinent past surgical history.     Family History   Problem Relation Age of Onset   ??? Headache Mother    ??? Hypertension Mother    ??? Heart Disease Father    ??? Hypertension Father    ??? Heart Attack Father    ??? Heart Disease Paternal Grandfather        Current Outpatient Prescriptions   Medication Sig Dispense Refill   ??? COMPLERA tablet        ??? lisinopril-hydrochlorothiazide (PRINZIDE, ZESTORETIC) 20-12.5 mg per tablet        ??? triamcinolone (ARISTOCORT) 0.5 % topical cream Apply  to affected area two (2) times a day. use thin layer  60 g  1   ??? loratadine (CLARITIN) 10 mg tablet Take 1 Tab by mouth daily.  30 Tab  3   ??? fluticasone (FLONASE) 50 mcg/actuation nasal spray 1 Spray by Both Nostrils route two (2) times daily as needed for Rhinitis. Indications:  CHRONIC NON-ALLERGIC RHINITIS  1 Bottle  5   ??? loratadine-pseudoephedrine (CLARITIN-D 24-HOUR) 10-240 mg per tablet Take 1 Tab by mouth daily. Indications: ALLERGIC RHINITIS  30 Tab  0       Allergies:  No Known Allergies      ROS:    Fever: Neg Shortness of Breath: Neg Rash: Neg Nausea/Vomiting: Neg Hearing Difficulty: Neg Back Pain: Neg Blurred Vision: Neg Weakness: Neg Chest Pain: Neg Memory Loss: Neg          PE:    Resp 12   Ht 5' 6.5" (1.689 m)   Wt 165 lb (74.844 kg)   BMI 26.24 kg/m2  Constitutional: WDWN, Pleasant and appropriate affect, No acute distress.    CV:  No peripheral swelling noted  Respiratory: No respiratory distress or difficulties  Abdomen:  No abdominal masses or tenderness.  No CVA tenderness. No hernias noted.   Skin: No jaundice.    Neuro/Psych:  Alert and oriented x 3. Affect appropriate.   Lymphatic:   No enlarged inguinal lymph nodes.            GU Male:    Prostate exam-declined, he prefers to be done under sedation when he has his procedure         LAB:    Results for orders placed in visit on 02/09/14   AMB POC PVR, MEAS,POST-VOID RES,US,NON-IMAGING       Result Value Ref Range    PVR 0     AMB POC URINALYSIS DIP STICK AUTO W/ MICRO (MICRO RESULTS)       Result Value Ref Range    Color (UA POC) Yellow      Clarity (UA POC) Clear      Glucose (UA POC) Negative  Negative    Bilirubin (UA POC) 2+  Negative    Ketones (UA POC) Trace  Negative    Specific gravity (UA POC) 1.030  1.001 - 1.035    Blood (UA POC) 3+  Negative    pH (UA POC) 6.0  4.6 - 8.0    Protein (UA POC) Trace  Negative mg/dL    Urobilinogen (UA POC) 0.2 mg/dL  0.2 - 1    Nitrites (UA POC) Negative  Negative    Leukocyte esterase (UA POC) Negative  Negative    Epithelial cells (UA POC) 0      WBCs (UA POC) 0      RBCs (UA POC) 8-10      Bacteria (UA POC) 0      Crystals (UA POC) 0  Negative    Other (UA POC) 0                      IMP:      1. Microscopic hematuria    2. Nocturia                    SUMMARY STATEMENT AND  PLAN:        The patient has persistent microscopic hematuria.  I recommend cystoscopy to complete his evaluation but given his age and anxiety, he wants it done under anesthesia.  By performing it under anesthesia, he would then give [name] the opportunity to perform retrograde pyelography and we will also do his rectal exam at that time.  I offered him the option of an office cystoscopy but he declined.  He understands the risks and accepts.  We will schedule him for outpatient cystoscopy, retrograde pyelography, fluoroscopy, and rectal exam.  He knows he will need to have somebody drive him home from the procedure.             PLEASE NOTE:   This document has been produced using voice recognition software. Unrecognized errors in transcription may be present    Claudius Sis, MD, F.A.C.S.

## 2014-02-10 LAB — CULTURE, URINE
Culture result:: NO GROWTH
Culture: NO GROWTH

## 2014-02-10 LAB — EKG, 12 LEAD, INITIAL
Atrial Rate: 80 {beats}/min
Calculated P Axis: 29 degrees
Calculated R Axis: 73 degrees
Calculated T Axis: 59 degrees
P-R Interval: 150 ms
Q-T Interval: 356 ms
QRS Duration: 98 ms
QTC Calculation (Bezet): 410 ms
Ventricular Rate: 80 {beats}/min

## 2014-02-19 ENCOUNTER — Inpatient Hospital Stay: Payer: MEDICAID

## 2014-02-19 LAB — DRUG SCREEN, URINE
AMPHETAMINES: NEGATIVE
BARBITURATES: NEGATIVE
BENZODIAZEPINES: NEGATIVE
COCAINE: NEGATIVE
METHADONE: NEGATIVE
OPIATES: NEGATIVE
PCP(PHENCYCLIDINE): NEGATIVE
THC (TH-CANNABINOL): NEGATIVE

## 2014-02-19 MED ORDER — ACETAMINOPHEN-CODEINE 300 MG-30 MG TAB
300-30 mg | ORAL_TABLET | Freq: Four times a day (QID) | ORAL | Status: DC | PRN
Start: 2014-02-19 — End: 2014-07-24

## 2014-02-19 MED ORDER — CIPROFLOXACIN 500 MG TAB
500 mg | ORAL_TABLET | Freq: Two times a day (BID) | ORAL | Status: AC
Start: 2014-02-19 — End: 2014-02-21

## 2014-02-19 MED ORDER — PHENAZOPYRIDINE 200 MG TAB
200 mg | ORAL_TABLET | Freq: Three times a day (TID) | ORAL | Status: AC | PRN
Start: 2014-02-19 — End: 2014-02-26

## 2014-02-19 MED ADMIN — iothalamate meglumine (CONRAY 60) 60 % contrast solution: @ 17:00:00 | NDC 00019095315

## 2014-02-19 MED ADMIN — lactated ringers infusion: INTRAVENOUS | @ 16:00:00 | NDC 00409795309

## 2014-02-19 MED ADMIN — gentamicin in Saline (Iso-osm) (GARAMYCIN) 80 mg/100 mL IVPB premix 80 mg: INTRAVENOUS | @ 17:00:00 | NDC 00338050348

## 2014-02-19 MED ADMIN — famotidine (PF) (PEPCID) injection 20 mg: INTRAVENOUS | @ 16:00:00 | NDC 00641602201

## 2014-02-19 MED ADMIN — ceFAZolin (ANCEF) 2g IVPB in 50 mL D5W: INTRAVENOUS | @ 17:00:00 | NDC 00264310511

## 2014-02-19 MED FILL — CEFAZOLIN 2 GM/50 ML IN DEXTROSE (ISO-OSMOTIC) IVPB: 2 gram/50 mL | INTRAVENOUS | Qty: 50

## 2014-02-19 MED FILL — LACTATED RINGERS IV: INTRAVENOUS | Qty: 1000

## 2014-02-19 MED FILL — FAMOTIDINE (PF) 20 MG/2 ML IV: 20 mg/2 mL | INTRAVENOUS | Qty: 2

## 2014-02-19 MED FILL — FENTANYL (PF) 100 MCG/2 ML (50 MCG/ML) INTRAVENOUS SYRINGE: 100 mcg/2 mL (50 mcg/mL) | INTRAVENOUS | Qty: 2

## 2014-02-19 MED FILL — CONRAY 60 % INJECTION SOLUTION: 60 % | INTRAMUSCULAR | Qty: 100

## 2014-02-19 MED FILL — GENTAMICIN IN SALINE (ISO-OSMOTIC) 80 MG/100 ML IV PIGGY BACK: 80 mg/100 mL | INTRAVENOUS | Qty: 100

## 2014-02-19 MED FILL — MIDAZOLAM 1 MG/ML IJ SOLN: 1 mg/mL | INTRAMUSCULAR | Qty: 2

## 2014-02-19 NOTE — Op Note (Signed)
OPERATIVE NOTE  02/19/2014, 1:14 PM    Pre Op Dx: Hematuria    Post Op Dx: Same    Procedure: Cystoscopy, bilateral retrograde pyelography with fluoroscopy, DRE     Surgeon: Haislee Corso R Alyn Jurney, MD, M.D    Anaesthesia: General    EBL: none    Drains: none    Complications: None    Specimens: None      OP NOTE:  After the patient was identified by his bracelet, he was brought to the operating room and after anesthesia was given, he was placed in the dorsolithotomy position. His perineum, genitalia, and inner thighs were prepped and draped in usual sterile manner.    Using a 21 French cystoscope, cystoscopy panendoscopy was performed with the following results:    The urethra was normal. The bladder outlet/prostate was short and nonobstructed. The bladder was entered without difficulty and both ureteral orifices were visualized and were of normal morphology and in normal position. The bladder capacity was adequate and there was 1 out of 4 trabeculation with no mucosal lesions identified. The bladder was examined with both 30 and 70?? lenses.    Using a Rutner catheter, bilateral retrograde pyelography was performed. This showed normal upper tracts bilaterally with no evidence of filling defects, stones, obstruction, or masses. Both upper urinary systems drained promptly. There were a few air bubbles injected on the left, but subsequent imaging showed clearance of those.    After full inspection of the bladder, again confirming no intraurethral or intravesical pathology, the cystoscope was removed. A DRE showed a 1+, smooth, symmetric prostate. The patient tolerated the procedure well and was taken to recovery room in stable condition.    PLAN:    The patient will be discharged today to follow up in the office.    Chipper Koudelka R. Doniel Maiello, M.D., F.A.C.S.

## 2014-02-19 NOTE — Interval H&P Note (Signed)
H&P Update:  Levi [name] was seen and examined.  History and physical has been reviewed. There have been no significant clinical changes since the completion of the originally dated History and Physical.    Signed By: Claudius SisLAWRENCE R Celesta Funderburk, MD     February 19, 2014 12:46 PM

## 2014-02-19 NOTE — Other (Signed)
I have reviewed discharge and medication reconciliation instructions with the patient and parent, Alexis GoodellSheila Brown.  The patient and parent verbalized understanding.

## 2014-02-19 NOTE — Other (Signed)
TRANSFER - IN REPORT:    Verbal report received from Nelida MeuseGreg Chastang, RN(name) on Levi [name]  being [facility] for routine post - op      Report consisted of patient???s Situation, Background, Assessment and   Recommendations(SBAR).     Information from the following report(s) SBAR, Procedure Summary, MAR and Recent Results was reviewed with the receiving nurse.    Opportunity for questions and clarification was provided.      Assessment completed upon patient???s arrival to unit and care assumed.

## 2014-02-19 NOTE — Other (Signed)
Patient armband removed and shredded.  Patient discharged via wheelchair.  Patient accompanied home with his mother.

## 2014-02-19 NOTE — Op Note (Signed)
OPERATIVE NOTE  02/19/2014, 1:14 PM    Pre Op Dx: Hematuria    Post Op Dx: Same    Procedure: Cystoscopy, bilateral retrograde pyelography with fluoroscopy, DRE     Surgeon: Claudius SisLAWRENCE R Glennice Marcos, MD, M.D    Anaesthesia: General    EBL: none    Drains: none    Complications: None    Specimens: None      OP NOTE:  After the patient was identified by his bracelet, he was brought to the operating room and after anesthesia was given, he was placed in the dorsolithotomy position. His perineum, genitalia, and inner thighs were prepped and draped in usual sterile manner.    Using a 21 French cystoscope, cystoscopy panendoscopy was performed with the following results:    The urethra was normal. The bladder outlet/prostate was short and nonobstructed. The bladder was entered without difficulty and both ureteral orifices were visualized and were of normal morphology and in normal position. The bladder capacity was adequate and there was 1 out of 4 trabeculation with no mucosal lesions identified. The bladder was examined with both 30 and 70?? lenses.    Using a Rutner catheter, bilateral retrograde pyelography was performed. This showed normal upper tracts bilaterally with no evidence of filling defects, stones, obstruction, or masses. Both upper urinary systems drained promptly. There were a few air bubbles injected on the left, but subsequent imaging showed clearance of those.    After full inspection of the bladder, again confirming no intraurethral or intravesical pathology, the cystoscope was removed. A DRE showed a 1+, smooth, symmetric prostate. The patient tolerated the procedure well and was taken to recovery room in stable condition.    PLAN:    The patient will be discharged today to follow up in the office.    Claudius SisLawrence R. Alorah Mcree, M.D., F.A.C.S.

## 2014-02-20 MED FILL — DIPRIVAN 10 MG/ML INTRAVENOUS EMULSION: 10 mg/mL | INTRAVENOUS | Qty: 20

## 2014-02-20 MED FILL — ONDANSETRON (PF) 4 MG/2 ML INJECTION: 4 mg/2 mL | INTRAMUSCULAR | Qty: 2

## 2014-02-20 MED FILL — LACTATED RINGERS IV: INTRAVENOUS | Qty: 1000

## 2014-02-20 MED FILL — DEXAMETHASONE SODIUM PHOSPHATE 4 MG/ML IJ SOLN: 4 mg/mL | INTRAMUSCULAR | Qty: 1

## 2014-02-20 MED FILL — LIDOCAINE (PF) 20 MG/ML (2 %) IJ SOLN: 20 mg/mL (2 %) | INTRAMUSCULAR | Qty: 5

## 2014-03-02 LAB — AMB POC URINALYSIS DIP STICK AUTO W/ MICRO (MICRO RESULTS)
Bacteria (UA POC): 0
Bilirubin (UA POC): NEGATIVE
Crystals (UA POC): 0
Epithelial cells (UA POC): 0
Glucose (UA POC): NEGATIVE
Ketones (UA POC): NEGATIVE
Leukocyte esterase (UA POC): NEGATIVE
Nitrites (UA POC): NEGATIVE
Other (UA POC): 0
Specific gravity (UA POC): 1.02 (ref 1.001–1.035)
Urobilinogen (UA POC): 0.2 (ref 0.2–1)
WBCs (UA POC): 0
pH (UA POC): 6.5 (ref 4.6–8.0)

## 2014-03-02 NOTE — Progress Notes (Signed)
CC: Microscopic hematuria    HPI:       Levi [name] is a 33 y.o. male who was referred by Babs BertinATHERINE P KNIGHT, MD (General) for evaluation of Microscopic hematuria.     The patient was sent for evaluation of microscopic hematuria, first seeing our practice in early June 2015. [facility] records indicated a urinalysis in November 2013 was 1+ blood on dipstick (no microscopic evaluation). [facility] records showed a urinalysis in May 2014 with 3-5 red cells per high-power field, 1 in January 2015 with 10-20 red cells per high-power field, and one in March 2015 with 5-10 red cells per high-power field. He had no upper tract [facility] studies [facility].  No gross heme. A [facility] U/S 01/13/14 showed normal kidneys.    He has no personal or family history of urologic malignancy.    Cystoscopy was recommended to complete his hematuria workup but he desired that procedure be performed under anesthesia.    Cystoscopy and bilateral retrograde pyelography showed normal bladder, normal upper tracts, and his rectal exam was performed and was normal.         AUA Assessment Score:  AUA Score: 1;    AUA Bother Rating:        Past Medical History   Diagnosis Date   ??? Hemorrhoids 07-2010   ??? GERD (gastroesophageal reflux disease)    ??? Hypertension    ??? Headache(784.0) 2010   ??? Contact dermatitis and other eczema, due to unspecified cause    ??? Chronic allergic rhinitis    ??? HIV infection (HCC)    ??? Asthma      denies asthma       History reviewed. No pertinent past surgical history.     Family History   Problem Relation Age of Onset   ??? Headache Mother    ??? Hypertension Mother    ??? Heart Disease Father    ??? Hypertension Father    ??? Heart Attack Father    ??? Heart Disease Paternal Grandfather        Current Outpatient Prescriptions   Medication Sig Dispense Refill   ??? COMPLERA tablet Take 1 Tab by mouth daily.     ??? lisinopril-hydrochlorothiazide (PRINZIDE, ZESTORETIC) 20-12.5 mg per  tablet Take 1 Tab by mouth daily. Indications: HYPERTENSION     ??? acetaminophen-codeine (TYLENOL #3) 300-30 mg per tablet Take 1 Tab by mouth every six (6) hours as needed for Pain. Max Daily Amount: 4 Tabs. 10 Tab 0       Allergies:  No Known Allergies      ROS:    Fever: Neg Shortness of Breath: Neg Rash: Neg Nausea/Vomiting: Neg Hearing Difficulty: Neg Back Pain: Neg Blurred Vision: Neg Weakness: Neg Chest Pain: Neg Memory Loss: Neg          PE:    Resp 13   Ht 5' 6.5" (1.689 m)   Wt 164 lb 12.8 oz (74.753 kg)   BMI 26.20 kg/m2  Constitutional: WDWN, Pleasant and appropriate affect, No acute distress.    CV:  No peripheral swelling noted  Respiratory: No respiratory distress or difficulties  Abdomen:  No abdominal masses or tenderness.  No CVA tenderness. No hernias noted.   Skin: No jaundice.    Neuro/Psych:  Alert and oriented x 3. Affect appropriate.   Lymphatic:   No enlarged inguinal lymph nodes.              LAB:  Results for orders placed or performed in visit on 03/02/14   AMB POC URINALYSIS DIP STICK AUTO W/ MICRO (MICRO RESULTS)   Result Value Ref Range    Color (UA POC) Amber     Clarity (UA POC) Clear     Glucose (UA POC) Negative Negative    Bilirubin (UA POC) Negative Negative    Ketones (UA POC) Negative Negative    Specific gravity (UA POC) 1.020 1.001 - 1.035    Blood (UA POC) 3+ Negative    pH (UA POC) 6.5 4.6 - 8.0    Protein (UA POC) Trace Negative mg/dL    Urobilinogen (UA POC) 0.2 mg/dL 0.2 - 1    Nitrites (UA POC) Negative Negative    Leukocyte esterase (UA POC) Negative Negative    Epithelial cells (UA POC) 0     WBCs (UA POC) 0     RBCs (UA POC) 6-8     Bacteria (UA POC) 0     Crystals (UA POC) 0 Negative    Other (UA POC) 0                   IMP:      1. Microscopic hematuria                    SUMMARY STATEMENT AND PLAN:       He???s had full urologic imaging and workup with a renal ultrasound, retrograde pyelography, and cystoscopy, with no abnormality found.  He  will return in 1 year for reassessment of his urinalysis to determine whether he has increasing, stable or improved microscopic hematuria.    He appears to have benign microscopic hematuria given the persistent microscopic hematuria with negative urologic workup.    He had a little bit of discomfort this morning with voiding, but it was more likely related to his urine concentration with possibly some urethral irritation from his previous cystoscopy.  The urinalysis showed no evidence of infection.  I encouraged him to hydrate better with water.             PLEASE NOTE:   This document has been produced using voice recognition software. Unrecognized errors in transcription may be present    Claudius SisLAWRENCE R Edrick Whitehorn, MD, F.A.C.S.

## 2014-07-24 ENCOUNTER — Ambulatory Visit: Admit: 2014-07-24 | Discharge: 2014-07-24 | Payer: PRIVATE HEALTH INSURANCE | Attending: Family | Primary: Family

## 2014-07-24 ENCOUNTER — Encounter: Attending: Family Medicine | Primary: Family

## 2014-07-24 DIAGNOSIS — L74519 Primary focal hyperhidrosis, unspecified: Secondary | ICD-10-CM

## 2014-07-24 MED ORDER — LORATADINE 10 MG TAB
10 mg | ORAL_TABLET | Freq: Every day | ORAL | Status: DC | PRN
Start: 2014-07-24 — End: 2014-11-18

## 2014-07-24 MED ORDER — PROPRANOLOL 60 MG TAB
60 mg | ORAL_TABLET | Freq: Two times a day (BID) | ORAL | Status: DC
Start: 2014-07-24 — End: 2015-02-23

## 2014-07-24 MED ORDER — ADAPALENE 0.1 % TOPICAL CREAM
0.1 % | Freq: Every evening | CUTANEOUS | Status: DC
Start: 2014-07-24 — End: 2015-12-03

## 2014-07-24 MED ORDER — PROPRANOLOL 60 MG TAB
60 mg | ORAL_TABLET | Freq: Two times a day (BID) | ORAL | Status: DC
Start: 2014-07-24 — End: 2014-07-24

## 2014-07-24 NOTE — Progress Notes (Signed)
HISTORY OF PRESENT ILLNESS  Levi [name] is a 33 y.o. male.  HPI: Patient comes to refill his Inderal, takes it for excessive sweating. It was given by his previous PCP. He is HIV positive and sees infectious disease at [facility]. Would like some thing for acne  Past Medical History   Diagnosis Date   ??? Hemorrhoids 07-2010   ??? GERD (gastroesophageal reflux disease)    ??? Hypertension    ??? Headache(784.0) 2010   ??? Contact dermatitis and other eczema, due to unspecified cause    ??? Chronic allergic rhinitis    ??? HIV infection (HCC)    ??? Asthma      denies asthma   No Known Allergies  Current outpatient prescriptions: loratadine (CLARITIN) 10 mg tablet, Take 1 Tab by mouth daily as needed for Allergies., Disp: 30 Tab, Rfl: 2;  propranolol (INDERAL) 60 mg tablet, Take 1 Tab by mouth two (2) times a day., Disp: 30 Tab, Rfl: 5;  adapalene (DIFFERIN) 0.1 % topical cream, Apply  to affected area nightly. use small amount as directed, Disp: 45 g, Rfl: 0;  COMPLERA tablet, Take 1 Tab by mouth daily., Disp: , Rfl:   Review of Systems   Constitutional: Negative.    Respiratory: Negative.    Cardiovascular: Negative.      Blood pressure 136/88, pulse 78, temperature 98.6 ??F (37 ??C), temperature source Oral, resp. rate 18, height 5\' 6"  (1.676 m), weight 171 lb (77.565 kg), SpO2 97 %.    Physical Exam   Constitutional: He appears well-developed and well-nourished.   HENT:   Mouth/Throat: Oropharynx is clear and moist.   Neck: Neck supple.   Cardiovascular: Normal rate, regular rhythm and normal heart sounds.    No murmur heard.  Pulmonary/Chest: Effort normal and breath sounds normal.   Nursing note and vitals reviewed.      ASSESSMENT and PLAN    ICD-10-CM ICD-9-CM    1. Excessive sweating, local L74.519 705.21 propranolol (INDERAL) 60 mg tablet   2. Chronic allergic rhinitis J30.9 477.9 loratadine (CLARITIN) 10 mg tablet   3. Acne vulgaris L70.0 706.1    Pt was given an after visit summary which includes diagnosis, current  medicines and vital and voiced understanding of treatment plan  '

## 2014-07-24 NOTE — Progress Notes (Signed)
1. Have you been to the ER, urgent care clinic since your last visit?  Hospitalized since your last visit?No    2. Have you seen or consulted any other health care providers outside of the [facility] since your last visit?  Include any pap smears or colon screening. No     Chief Complaint   Patient presents with   ??? Hypertension     needs refill of propanolol-needs pcp to monitor HTN

## 2014-08-10 NOTE — Telephone Encounter (Signed)
PA has been sent to [facility]. Awaiting response

## 2014-08-25 NOTE — Telephone Encounter (Signed)
PA has been approved. Documentation will be scanned in to the patient's chart.

## 2014-11-18 ENCOUNTER — Encounter

## 2014-11-18 MED ORDER — LORATADINE 10 MG TAB
10 mg | ORAL_TABLET | Freq: Every day | ORAL | Status: DC | PRN
Start: 2014-11-18 — End: 2015-04-30

## 2014-11-18 NOTE — Telephone Encounter (Signed)
From: Levi KailVictor [name]  To: Levi HughsSoeria D Sanderford, NP  Sent: 11/18/2014 3:37 PM EDT  Subject:  Medication Renewal Request    Original  authorizing provider: Matthias HughsSOERIA D SANDERFORD, NP    Levi KailVictor  Zellner would like a refill of the following medications:  loratadine  (CLARITIN) 10 mg tablet Levi [name][Levi D SANDERFORD, NP]    [facility]: [phone] [place] - [address]    Comment:

## 2015-02-17 ENCOUNTER — Encounter: Attending: Family | Primary: Family

## 2015-02-23 ENCOUNTER — Encounter

## 2015-02-23 NOTE — Telephone Encounter (Signed)
Christa from [facility] pharmacy called requesting refill for patient. Call back number [phone].    Requested Prescriptions     Pending Prescriptions Disp Refills   ??? propranolol (INDERAL) 60 mg tablet 30 Tab 5     Sig: Take 1 Tab by mouth two (2) times a day.

## 2015-02-24 MED ORDER — PROPRANOLOL 60 MG TAB
60 mg | ORAL_TABLET | Freq: Two times a day (BID) | ORAL | Status: DC
Start: 2015-02-24 — End: 2015-06-15

## 2015-03-11 NOTE — Telephone Encounter (Signed)
Old Dr. Kennon PortelaVolz pt, called patient to schedule an annual f/u appt with a new provider.  No answer at number provided, message left.    Return in about 1 year (around 03/03/2015) for Hematuria F/U.

## 2015-03-31 ENCOUNTER — Ambulatory Visit: Admit: 2015-03-31 | Payer: PRIVATE HEALTH INSURANCE | Attending: Family | Primary: Family

## 2015-03-31 DIAGNOSIS — Z0000 Encounter for general adult medical examination without abnormal findings: Secondary | ICD-10-CM

## 2015-03-31 NOTE — Progress Notes (Signed)
Subjective:   Levi [name] is a 34 y.o. male presenting for his annual checkup.    ROS:  Feeling well. No dyspnea or chest pain on exertion. No abdominal pain, change in bowel habits, black or bloody stools. No urinary tract or prostatic symptoms. No neurological complaints.    Specific concerns today: Patient doesn't want labs work, stating that [facility], will get the results    Patient Active Problem List    Diagnosis Date Noted   ??? HIV positive (HCC) 07/24/2014   ??? Acne vulgaris 07/24/2014   ??? Microscopic hematuria 03/02/2014   ??? Hemorrhoids    ??? Headache    ??? Chronic allergic rhinitis      Current Outpatient Prescriptions   Medication Sig Dispense Refill   ??? propranolol (INDERAL) 60 mg tablet Take 1 Tab by mouth two (2) times a day. 30 Tab 5   ??? loratadine (CLARITIN) 10 mg tablet Take 1 Tab by mouth daily as needed for Allergies. 30 Tab 5   ??? adapalene (DIFFERIN) 0.1 % topical cream Apply  to affected area nightly. use small amount as directed 45 g 0   ??? COMPLERA tablet Take 1 Tab by mouth daily.       No Known Allergies  Past Medical History   Diagnosis Date   ??? Hemorrhoids 07-2010   ??? GERD (gastroesophageal reflux disease)    ??? Hypertension    ??? Headache(784.0) 2010   ??? Contact dermatitis and other eczema, due to unspecified cause    ??? Chronic allergic rhinitis    ??? HIV infection (HCC)    ??? Asthma      denies asthma     History reviewed. No pertinent past surgical history.  Family History   Problem Relation Age of Onset   ??? Headache Mother    ??? Hypertension Mother    ??? Heart Disease Father    ??? Hypertension Father    ??? Heart Attack Father    ??? Heart Disease Paternal Grandfather    ??? Breast Cancer Mother      survivor   ??? Migraines Mother      History   Substance Use Topics   ??? Smoking status: Never Smoker    ??? Smokeless tobacco: Never Used   ??? Alcohol Use: Yes      Comment: occasionally        Objective:      BP 108/80 mmHg   Pulse 60   Temp(Src) 98.1 ??F (36.7 ??C) (Oral)   Resp 18   Ht 5\' 6"  (1.676 m)   Wt 168 lb 9.6 oz (76.476 kg)   BMI 27.23 kg/m2   SpO2 99%  The patient appears well, alert, oriented x 3, in no distress.   ENT normal.  Neck supple. No adenopathy or thyromegaly. PERLA. Lungs are clear, good air entry, no wheezes, rhonchi or rales. S1 and S2 normal, no murmurs, regular rate and rhythm. Abdomen is soft without tenderness, guarding, mass or organomegaly. GU exam: no penile lesions or discharge, no testicular masses or tenderness, no hernias.  Extremities show no edema, normal peripheral pulses. Neurological is normal without focal findings.    Assessment/Plan:   healthy adult male  routine labs ordered.    ICD-10-CM ICD-9-CM    1. Routine general medical examination at a health care facility Z00.00 V70.0 CANCELED: CBC WITH AUTOMATED DIFF      CANCELED: LIPID PANEL      CANCELED: METABOLIC PANEL,  COMPREHENSIVE      CANCELED: VITAMIN D, 25 HYDROXY      CANCELED: TSH 3RD GENERATION   .will get labs from [facility]  Pt was given an after visit summary which includes diagnosis, current medicines and vital and voiced understanding of treatment plan

## 2015-03-31 NOTE — Progress Notes (Signed)
1. Have you been to the ER, urgent care clinic since your last visit?  Hospitalized since your last visit?No    2. Have you seen or consulted any other health care providers outside of the [facility] since your last visit?  Include any pap smears or colon screening. No     Chief Complaint   Patient presents with   ??? Complete Physical     follow up     Learning Assessment 03/31/2015   PRIMARY LEARNER Patient   PRIMARY LANGUAGE ENGLISH   LEARNER PREFERENCE PRIMARY READING   ANSWERED BY Leodan Derosa   RELATIONSHIP SELF     Health Maintenance reviewed with patient.(may need tdap)

## 2015-04-14 ENCOUNTER — Ambulatory Visit: Admit: 2015-04-14 | Payer: PRIVATE HEALTH INSURANCE | Attending: Family Medicine | Primary: Family

## 2015-04-14 ENCOUNTER — Inpatient Hospital Stay: Admit: 2015-04-14 | Primary: Family

## 2015-04-14 DIAGNOSIS — R059 Cough, unspecified: Secondary | ICD-10-CM

## 2015-04-14 MED ORDER — FLUTICASONE 50 MCG/ACTUATION NASAL SPRAY, SUSP
50 mcg/actuation | NASAL | Status: DC
Start: 2015-04-14 — End: 2016-10-06

## 2015-04-14 MED ORDER — AZITHROMYCIN 250 MG TAB
250 mg | ORAL_TABLET | ORAL | Status: DC
Start: 2015-04-14 — End: 2015-12-03

## 2015-04-14 NOTE — Progress Notes (Signed)
Chief Complaint   Patient presents with   ??? Cold Symptoms     SUBJECTIVE:    Subjective:     Patient complains of cold symptoms     Duration (hours, days,weeks,longstanding):   8 days     Associated signs and symptoms (any other than main complaint?):   Headache, congestion, cough, itchy watery eyes    Severity is stable but not getting better    Past history is not significant for history of pneumonia or bronchitis.    Has HIV, on therapy, last CD4 count was unsure; not sure name of doctor treating this; goes to [facility]    Patient is not a smoker    No history of sinus surgery or repeat sinus infections.     No tooth pain.      has history of seasonal allergies which patient usually takes claritin and taking now    Past Medical History   Diagnosis Date   ??? Hemorrhoids 07-2010   ??? GERD (gastroesophageal reflux disease)    ??? Hypertension    ??? Headache(784.0) 2010   ??? Contact dermatitis and other eczema, due to unspecified cause    ??? Chronic allergic rhinitis    ??? HIV infection (HCC)    ??? Asthma      denies asthma       Current Outpatient Prescriptions   Medication Sig Dispense Refill   ??? ODEFSEY 200-25-25 mg tab      ??? ergocalciferol (ERGOCALCIFEROL) 50,000 unit capsule      ??? propranolol (INDERAL) 60 mg tablet Take 1 Tab by mouth two (2) times a day. 30 Tab 5   ??? loratadine (CLARITIN) 10 mg tablet Take 1 Tab by mouth daily as needed for Allergies. 30 Tab 5   ??? adapalene (DIFFERIN) 0.1 % topical cream Apply  to affected area nightly. use small amount as directed 45 g 0       No Known Allergies      OBJECTIVE:  Filed Vitals:    04/14/15 0928   BP: 134/76   Pulse: 85   Temp: 98.2 ??F (36.8 ??C)   TempSrc: Oral   Resp: 14   Height: 5\' 6"  (1.676 m)   Weight: 170 lb 12.8 oz (77.474 kg)   SpO2: 100%         GEN: NAD  Eyes: PERRL, sclera white, conjunctiva pink and  MMM  Nose: bilateral nasal turbinate swelling with clear drainage and erythema     Ears:   Left TM: mild clear effusion no erythema on TM   Left external canal: without lesions or drainage    RightTM: mild clear effusion no erythema on TM  Right external canal: without lesions or drainage    Pulm: CTA bilat  Mouth: MMM, posterior OP is with mild cobblestone appearance and erythema  Skin: no rash    OTHER information:     Called to discuss patient case with primary provider of management of HIV; [facility] Sus[facility] ManorJane Kaatz[facility].  Reviewed case with her, she recommended following empiric treatment with either 3-5 day course of azithromycin    Per our conversation, she stated his last CD4 count was >400, last viral load undetectable         A/P:    ICD-10-CM ICD-9-CM    1. Cough R05 786.2 CMV AB, IGG      CMV AB, IGM      CBC W/O DIFF      XR CHEST PA LAT  2. Sinus pressure J34.89 478.19 CMV AB, IGG      CMV AB, IGM      CBC W/O DIFF      XR CHEST PA LAT      fluticasone (FLONASE) 50 mcg/actuation nasal spray      CULTURE, BODY FLUID W GRAM STAIN   3. HIV positive (HCC) Z21 V08 CMV AB, IGG      CMV AB, IGM      CBC W/O DIFF      XR CHEST PA LAT      CULTURE, BODY FLUID W GRAM STAIN     Possible allergy component as well but given HIV status, will need to empirically cover with antibiotics and then direct antibiotics based on results of lab work.       Directions:   fluticasone (FLONASE) 50 mcg/actuation nasal spray 2 sprays by Both Nostrils route two (2) times a day. 2 sprays each nose BID X 3 days then 2 sprays each nose daily thereafter X 11 more days     Follow-up Disposition:  Return in about 1 week (around 04/21/2015) for CAP follow up.either here or at [facility] infectious disease     Send lab results and notes to NP Susy Manor once resulted    Dr. Orpah Melter,  D.O.  Physician

## 2015-04-14 NOTE — Progress Notes (Signed)
Quick Note:        Inform pt that cxr was normal. What is name of doctor at [facility] managing his HIV?    CV    ______

## 2015-04-14 NOTE — Progress Notes (Signed)
Chief Complaint   Patient presents with   ??? Cold Symptoms       1. Have you been to the ER, urgent care clinic since your last visit?  Hospitalized since your last visit?No    2. Have you seen or consulted any other health care providers outside of the [facility] since your last visit?  Include any pap smears or colon screening. No    Body mass index is 27.58 kg/(m^2).    Subjective:    Patient complains of cold symptoms    Duration (hours, days,weeks,longstanding):   8 days      Associated signs and symptoms (any other than main complaint?):   Headache, congestion, cough, itchy watery eyes

## 2015-04-14 NOTE — Progress Notes (Signed)
Quick Note:        Advised patient of results and recommendations, patient understood and agreed to understanding.        ______

## 2015-04-14 NOTE — Patient Instructions (Signed)
Cough: Care Instructions  Your Care Instructions  A cough is your body's response to something that bothers your throat or airways. Many things can cause a cough. You might cough because of a cold or the flu, bronchitis, or asthma. Smoking, postnasal drip, allergies, and stomach acid that backs up into your throat also can cause coughs.  A cough is a symptom, not a disease. Most coughs stop when the cause, such as a cold, goes away. You can take a few steps at home to cough less and feel better.  Follow-up care is a key part of your treatment and safety. Be sure to make and go to all appointments, and call your doctor if you are having problems. It's also a good idea to know your test results and keep a list of the medicines you take.  How can you care for yourself at home?  ?? Drink lots of water and other fluids. This helps thin the mucus and soothes a dry or sore throat. Honey or lemon juice in hot water or tea may ease a dry cough.  ?? Take cough medicine as directed by your doctor.  ?? Prop up your head on pillows to help you breathe and ease a dry cough.  ?? Try cough drops to soothe a dry or sore throat. Cough drops don't stop a cough. Medicine-flavored cough drops are no better than candy-flavored drops or hard candy.  ?? Do not smoke. Avoid secondhand smoke. If you need help quitting, talk to your doctor about stop-smoking programs and medicines. These can increase your chances of quitting for good.  When should you call for help?  Call 911 anytime you think you may need emergency care. For example, call if:  ?? You have severe trouble breathing.  Call your doctor now or seek immediate medical care if:  ?? You cough up blood.  ?? You have new or worse trouble breathing.  ?? You have a new or higher fever.  ?? You have a new rash.  Watch closely for changes in your health, and be sure to contact your doctor if:  ?? You cough more deeply or more often, especially if you notice more mucus  or a change in the color of your mucus.  ?? You have new symptoms, such as a sore throat, an earache, or sinus pain.  ?? You do not get better as expected.  Where can you learn more?  Go to http://www.healthwise.net/GoodHelpConnections  Enter D279 in the search box to learn more about "Cough: Care Instructions."  ?? 2006-2016 [employer]. Care instructions adapted under license by [employer] (which disclaims liability or warranty for this information). This care instruction is for use with your licensed healthcare professional. If you have questions about a medical condition or this instruction, always ask your healthcare professional. Healthwise, Incorporated disclaims any warranty or liability for your use of this information.  Content Version: 10.9.538570; Current as of: April 24, 2014

## 2015-04-15 LAB — CBC W/O DIFF
HCT: 38.9 % (ref 37.5–51.0)
HGB: 12.6 g/dL (ref 12.6–17.7)
MCH: 30.4 pg (ref 26.6–33.0)
MCHC: 32.4 g/dL (ref 31.5–35.7)
MCV: 94 fL (ref 79–97)
PLATELET: 288 10*3/uL (ref 150–379)
RBC: 4.14 x10E6/uL (ref 4.14–5.80)
RDW: 13.1 % (ref 12.3–15.4)
WBC: 5.8 10*3/uL (ref 3.4–10.8)

## 2015-04-15 LAB — CMV AB, IGM: CYTOMEGALOVIRUS (CMV) AB, IGM: <30 AU/mL (ref 0.0–29.9)

## 2015-04-15 LAB — CMV AB, IGG: CYTOMEGALOVIRUS (CMV) AB, IGG: >10 U/mL — ABNORMAL HIGH (ref 0.00–0.59)

## 2015-04-16 NOTE — Progress Notes (Signed)
Have patient come in tomorrow for re-evaluation please.  Need additional blood testing as well.  CV

## 2015-04-16 NOTE — Progress Notes (Signed)
Called patient per Dr. Lorenza BurtonVarneys request. Patient states he is starting to feel better.

## 2015-04-16 NOTE — Progress Notes (Signed)
Patient has no way to get to the office tomorrow. Can he be seen another day?

## 2015-04-16 NOTE — Progress Notes (Signed)
Can you call patient and see how he is feeling?  CV

## 2015-04-17 LAB — UPPER RESPIRATORY CULTURE

## 2015-04-17 LAB — TEST CODE CHANGE

## 2015-04-17 LAB — SPECIMEN STATUS REPORT

## 2015-04-19 NOTE — Progress Notes (Signed)
Requested records sent

## 2015-04-30 ENCOUNTER — Encounter

## 2015-04-30 MED ORDER — LORATADINE 10 MG TAB
10 mg | ORAL_TABLET | ORAL | 1 refills | Status: DC
Start: 2015-04-30 — End: 2015-06-28

## 2015-06-15 ENCOUNTER — Encounter

## 2015-06-15 MED ORDER — PROPRANOLOL 60 MG TAB
60 mg | ORAL_TABLET | ORAL | 0 refills | Status: DC
Start: 2015-06-15 — End: 2015-06-29

## 2015-06-28 ENCOUNTER — Encounter

## 2015-06-28 MED ORDER — LORATADINE 10 MG TAB
10 mg | ORAL_TABLET | ORAL | 1 refills | Status: DC
Start: 2015-06-28 — End: 2015-08-23

## 2015-06-29 ENCOUNTER — Encounter

## 2015-06-29 MED ORDER — PROPRANOLOL 60 MG TAB
60 mg | ORAL_TABLET | ORAL | 2 refills | Status: DC
Start: 2015-06-29 — End: 2015-09-17

## 2015-08-23 ENCOUNTER — Encounter

## 2015-08-23 MED ORDER — LORATADINE 10 MG TAB
10 mg | ORAL_TABLET | ORAL | 0 refills | Status: DC
Start: 2015-08-23 — End: 2015-09-17

## 2015-09-17 ENCOUNTER — Encounter

## 2015-09-21 MED ORDER — PROPRANOLOL 60 MG TAB
60 mg | ORAL_TABLET | ORAL | 1 refills | Status: DC
Start: 2015-09-21 — End: 2015-11-15

## 2015-09-21 MED ORDER — LORATADINE 10 MG TAB
10 mg | ORAL_TABLET | ORAL | 1 refills | Status: DC
Start: 2015-09-21 — End: 2015-11-15

## 2015-11-15 ENCOUNTER — Encounter

## 2015-11-15 MED ORDER — PROPRANOLOL 60 MG TAB
60 mg | ORAL_TABLET | ORAL | 0 refills | Status: DC
Start: 2015-11-15 — End: 2015-12-13

## 2015-11-15 MED ORDER — LORATADINE 10 MG TAB
10 mg | ORAL_TABLET | ORAL | 1 refills | Status: DC
Start: 2015-11-15 — End: 2016-01-20

## 2015-12-03 ENCOUNTER — Ambulatory Visit: Admit: 2015-12-03 | Discharge: 2015-12-03 | Payer: PRIVATE HEALTH INSURANCE | Attending: Acute Care | Primary: Family

## 2015-12-03 DIAGNOSIS — T50905A Adverse effect of unspecified drugs, medicaments and biological substances, initial encounter: Secondary | ICD-10-CM

## 2015-12-03 MED ORDER — HYDROXYZINE 25 MG TAB
25 mg | ORAL_TABLET | Freq: Three times a day (TID) | ORAL | 0 refills | Status: AC | PRN
Start: 2015-12-03 — End: 2015-12-13

## 2015-12-03 MED ORDER — HYDRALAZINE 25 MG TAB
25 mg | ORAL_TABLET | Freq: Three times a day (TID) | ORAL | 1 refills | Status: DC
Start: 2015-12-03 — End: 2015-12-20

## 2015-12-03 NOTE — Patient Instructions (Addendum)
Hives: Care Instructions  Your Care Instructions  Hives are raised, red, itchy patches of skin. They are also called wheals or welts. They usually have red borders and pale centers. Hives range in size from ?? inch to 3 inches or more across. They may seem to move from place to place on the skin. Several hives may form a large area of raised, red skin.  You can get hives after an insect sting, after taking medicine or eating certain foods, or because of infection or stress. Other causes include plants, things you breathe in, makeup, heat, cold, sunlight, and latex.  You cannot spread hives to other people. Hives may last a few minutes or a few days, but a single spot may last less than 36 hours.  Follow-up care is a key part of your treatment and safety. Be sure to make and go to all appointments, and call your doctor if you are having problems. It's also a good idea to know your test results and keep a list of the medicines you take.  How can you care for yourself at home?  ?? Avoid whatever you think may have caused your hives, such as a certain food or medicine. However, you may not know the cause.  ?? Put a cool, wet towel on the area to relieve itching.  ?? Take an over-the-counter antihistamine, such as diphenhydramine (Benadryl), cetirizine (Zyrtec), or loratadine (Claritin), to help stop the hives and calm the itching. Read and follow directions on the label. These medicines can make you feel sleepy. Do not drive while using them.  ?? Stay away from strong soaps, detergents, and chemicals. These can make itching worse.  When should you call for help?  Call 911 anytime you think you may need emergency care. For example, call if:  ?? You have symptoms of a severe allergic reaction. These may include:  ?? Sudden raised, red areas (hives) all over your body.  ?? Swelling of the throat, mouth, lips, or tongue.  ?? Trouble breathing.  ?? Passing out (losing consciousness). Or you may feel very lightheaded or  suddenly feel weak, confused, or restless.  Call your doctor now or seek immediate medical care if:  ?? You have symptoms of an allergic reaction, such as:  ?? A rash or hives (raised, red areas on the skin).  ?? Itching.  ?? Swelling.  ?? Belly pain, nausea, or vomiting.  ?? You get hives after you start a new medicine.  ?? Hives have not gone away after 24 hours.  Watch closely for changes in your health, and be sure to contact your doctor if:  ?? You do not get better as expected.  Where can you learn more?  Go to http://www.healthwise.net/GoodHelpConnections.  Enter K772 in the search box to learn more about "Hives: Care Instructions."  Current as of: Jan 29, 2015  Content Version: 11.2  ?? 2006-2017 Healthwise, [employer]. Care instructions adapted under license by [employer] (which disclaims liability or warranty for this information). If you have questions about a medical condition or this instruction, always ask your healthcare professional. Healthwise, Incorporated disclaims any warranty or liability for your use of this information.

## 2015-12-03 NOTE — Progress Notes (Signed)
HISTORY OF PRESENT ILLNESS  Levi [name] is a 35 y.o. male.  HPI Patient presents to office today for complaints of a rash that started in December of last year after being changed to a different HIV agent. He has reported this to [facility] who manages his HIV. There they  started ketoconazole. He states he has been using it faithfully  without improvement.  The new medication Odefsey,which he has been switched to has an adverse reaction known to cause severe skin reaction.      Review of Systems   Constitutional: Negative for chills and fever.   Respiratory: Negative for cough.    Skin: Positive for itching and rash.     Visit Vitals   ??? BP (!) 180/105 (BP 1 Location: Right arm, BP Patient Position: Sitting)   ??? Pulse 74   ??? Temp 97.3 ??F (36.3 ??C) (Oral)   ??? Resp 18   ??? Ht 5\' 6"  (1.676 m)   ??? Wt 181 lb 8 oz (82.3 kg)   ??? SpO2 97%   ??? BMI 29.29 kg/m2     Current Outpatient Prescriptions on File Prior to Visit   Medication Sig Dispense Refill   ??? propranolol (INDERAL) 60 mg tablet TAKE 1 TABLET BY MOUTH TWICE A DAY 60 Tab 0   ??? loratadine (CLARITIN) 10 mg tablet TAKE 1 TABLET BY MOUTH DAILY AS NEEDED FOR ALLERGIES 30 Tab 1   ??? ODEFSEY 200-25-25 mg tab      ??? ergocalciferol (ERGOCALCIFEROL) 50,000 unit capsule      ??? fluticasone (FLONASE) 50 mcg/actuation nasal spray 2 sprays each nostril BID for 3 days then 2 sprays each nostril daily for additional 11 days. 1 Bottle 1     No current facility-administered medications on file prior to visit.        Physical Exam   Constitutional: He appears well-developed and well-nourished.   Skin: Rash noted. Rash is urticarial.            ASSESSMENT and PLAN  Alecia LemmingVictor was seen today for rash.    Diagnoses and all orders for this visit:    Urticaria due to drug allergy  -     hydrOXYzine HCl (ATARAX) 25 mg tablet; Take 1 Tab by mouth three (3)  times daily as needed for Itching for up to 10 days. Indications: PRURITUS OF SKIN, URTICARIA    Secondary hypertension  -     hydrALAZINE (APRESOLINE) 25 mg tablet; Take 1 Tab by mouth three (3) times daily. Indications: hypertension    As we discussed your blood pressure is abnormally elevated. I have started you on a new blood pressure medication. It is imperative you follow up with your PCP in 2 weeks to follow up.    Aldean Bakerathy Tayvin Preslar, NP

## 2015-12-03 NOTE — Progress Notes (Signed)
Chief Complaint   Patient presents with   ??? Rash     rash on neck, arms, groin, behind knees X 4 months      1. Have you been to the ER, urgent care clinic since your last visit?  Hospitalized since your last visit?Yes Reason for visit: Migraine-05/2015-[facility]    2. Have you seen or consulted any other health care providers outside of the [facility] since your last visit?  Include any pap smears or colon screening. No

## 2015-12-03 NOTE — Telephone Encounter (Signed)
Bree @ [facility]  [phone]    Wallace CullensBree is requested a return call.  She wants to verify that the patient should taking hydralazine (prescribed by NP Sanderford)  and propanolol (prescribed by NP Jones).

## 2015-12-03 NOTE — Telephone Encounter (Signed)
Called and left a message to confirm that patient is prescribed both propanolol for excessive sweating and hydralazine for HTN.

## 2015-12-13 ENCOUNTER — Encounter

## 2015-12-13 MED ORDER — PROPRANOLOL 60 MG TAB
60 mg | ORAL_TABLET | ORAL | 0 refills | Status: DC
Start: 2015-12-13 — End: 2015-12-20

## 2015-12-20 ENCOUNTER — Ambulatory Visit: Admit: 2015-12-20 | Payer: PRIVATE HEALTH INSURANCE | Attending: Family | Primary: Family

## 2015-12-20 DIAGNOSIS — I10 Essential (primary) hypertension: Secondary | ICD-10-CM

## 2015-12-20 MED ORDER — PROPRANOLOL 60 MG TAB
60 mg | ORAL_TABLET | Freq: Two times a day (BID) | ORAL | 0 refills | Status: DC
Start: 2015-12-20 — End: 2016-10-12

## 2015-12-20 NOTE — Telephone Encounter (Signed)
Verified dOB  Informed patient they need to call to set up consultation for sleep studies. The number he needs to call is [phone]

## 2015-12-20 NOTE — Progress Notes (Signed)
HISTORY OF PRESENT ILLNESS  Levi KailVictor [name] is a 35 y.o. male.  HPI: Two weeks follow up for HTN, last office visit his blood pressure was elevated, hydralazine 25 mg was added and advised to follow up in two weeks. Pt reports after taking medication for 2-3 days he had a headache and thus stopped taking hydralazine. Today his blood pressure is in normal range and he denies headache. [facility] three months. They check all of his blood work.  Patient C/O snoring.   Past Medical History:   Diagnosis Date   ??? Asthma     denies asthma   ??? Chronic allergic rhinitis    ??? Contact dermatitis and other eczema, due to unspecified cause    ??? GERD (gastroesophageal reflux disease)    ??? Headache 2010   ??? Hemorrhoids 07-2010   ??? HIV infection (HCC)    ??? Hypertension    No Known Allergies    Current Outpatient Prescriptions:   ???  TRIUMEQ tablet, , Disp: , Rfl:   ???  propranolol (INDERAL) 60 mg tablet, Take 1 Tab by mouth two (2) times a day., Disp: 60 Tab, Rfl: 0  ???  ketoconazole (NIZORAL) 2 % topical cream, Apply  to affected area., Disp: , Rfl:   ???  loratadine (CLARITIN) 10 mg tablet, TAKE 1 TABLET BY MOUTH DAILY AS NEEDED FOR ALLERGIES, Disp: 30 Tab, Rfl: 1  ???  ergocalciferol (ERGOCALCIFEROL) 50,000 unit capsule, , Disp: , Rfl:   ???  fluticasone (FLONASE) 50 mcg/actuation nasal spray, 2 sprays each nostril BID for 3 days then 2 sprays each nostril daily for additional 11 days., Disp: 1 Bottle, Rfl: 1  ???  ODEFSEY 200-25-25 mg tab, , Disp: , Rfl:   Review of Systems   Constitutional: Negative.    Respiratory: Negative.    Cardiovascular: Negative.    Gastrointestinal: Negative.    Blood pressure 124/80, pulse 67, temperature 98.1 ??F (36.7 ??C), temperature source Oral, resp. rate 18, height 5\' 6"  (1.676 m), weight 181 lb 3.2 oz (82.2 kg), SpO2 98 %.    Physical Exam   Constitutional: No distress.   HENT:   Mouth/Throat: Oropharynx is clear and moist.   Neck: Neck supple.    Cardiovascular: Normal rate and regular rhythm.    No murmur heard.  Pulmonary/Chest: Effort normal and breath sounds normal.   Abdominal: Soft. Bowel sounds are normal.   Nursing note and vitals reviewed.      ASSESSMENT and PLAN    ICD-10-CM ICD-9-CM    1. HTN (hypertension), benign I10 401.1 propranolol (INDERAL) 60 mg tablet      METABOLIC PANEL, COMPREHENSIVE   2. Snoring R06.83 786.09 REFERRAL TO SLEEP STUDIES   await labs  Follow up as needed  Pt was given an after visit summary which includes diagnosis, current medicines and vital and voiced understanding of treatment plan

## 2015-12-20 NOTE — Progress Notes (Signed)
1. Have you been to the ER, urgent care clinic since your last visit?  Hospitalized since your last visit?No    2. Have you seen or consulted any other health care providers outside of the [facility] since your last visit?  Include any pap smears or colon screening. No     Chief Complaint   Patient presents with   ??? Blood Pressure Check     patient is here per Dr Yetta BarreJones for elevated blood pressure     Learning Assessment 03/31/2015   PRIMARY LEARNER Patient   PRIMARY LANGUAGE ENGLISH   LEARNER PREFERENCE PRIMARY READING   ANSWERED BY Kyriakos Swaim   RELATIONSHIP SELF

## 2015-12-21 LAB — METABOLIC PANEL, COMPREHENSIVE
A-G Ratio: 1.9 (ref 1.2–2.2)
ALT (SGPT): 17 IU/L (ref 0–44)
AST (SGOT): 15 IU/L (ref 0–40)
Albumin: 4.7 g/dL (ref 3.5–5.5)
Alk. phosphatase: 46 IU/L (ref 39–117)
BUN/Creatinine ratio: 11 (ref 9–20)
BUN: 13 mg/dL (ref 6–20)
Bilirubin, total: 0.3 mg/dL (ref 0.0–1.2)
CO2: 23 mmol/L (ref 18–29)
Calcium: 9.7 mg/dL (ref 8.7–10.2)
Chloride: 100 mmol/L (ref 96–106)
Creatinine: 1.16 mg/dL (ref 0.76–1.27)
GFR est AA: 92 mL/min/{1.73_m2} (ref >59)
GFR est non-AA: 80 mL/min/{1.73_m2} (ref >59)
GLOBULIN, TOTAL: 2.5 g/dL (ref 1.5–4.5)
Glucose: 78 mg/dL (ref 65–99)
Potassium: 4.7 mmol/L (ref 3.5–5.2)
Protein, total: 7.2 g/dL (ref 6.0–8.5)
Sodium: 139 mmol/L (ref 134–144)

## 2015-12-28 NOTE — Telephone Encounter (Signed)
-----   Message from Magdalene Patriciaameka Gaines sent at 12/27/2015  4:25 PM EDT -----  Regarding: NP.Sanderford/Refill  Brie from [facility] is advising the office Pt doesn't want to send " Hydralazine" Rx due to headaches. Best contact [phone].

## 2015-12-28 NOTE — Telephone Encounter (Signed)
Attempted to call Brie back but to avail, left her a voicemail message to call office back.

## 2015-12-28 NOTE — Telephone Encounter (Signed)
Pt has been notified to follow up on BP, he states he will call later for an appt.

## 2015-12-28 NOTE — Telephone Encounter (Signed)
Brie called and reported that pt states that his Hydralazine has been causing him headaches and would not need a refill on that. She would like PCP to be informed. This was discontinued by NP Jones when Pt saw him last;12/20/2015. Brie has also been informed on this . Also went over the rest of pt medication with her.Understanding voiced. FYI Soeria.

## 2016-01-10 ENCOUNTER — Encounter

## 2016-01-10 MED ORDER — PROPRANOLOL 60 MG TAB
60 mg | ORAL_TABLET | ORAL | 1 refills | Status: DC
Start: 2016-01-10 — End: 2016-03-02

## 2016-01-20 ENCOUNTER — Encounter

## 2016-01-24 MED ORDER — LORATADINE 10 MG TAB
10 mg | ORAL_TABLET | ORAL | 0 refills | Status: DC
Start: 2016-01-24 — End: 2016-02-07

## 2016-02-07 ENCOUNTER — Encounter

## 2016-02-07 MED ORDER — LORATADINE 10 MG TAB
10 mg | ORAL_TABLET | ORAL | 3 refills | Status: DC
Start: 2016-02-07 — End: 2016-05-26

## 2016-03-02 ENCOUNTER — Encounter

## 2016-03-03 MED ORDER — PROPRANOLOL 60 MG TAB
60 mg | ORAL_TABLET | ORAL | 1 refills | Status: DC
Start: 2016-03-03 — End: 2016-04-28

## 2016-04-28 ENCOUNTER — Encounter

## 2016-04-28 MED ORDER — PROPRANOLOL 60 MG TAB
60 mg | ORAL_TABLET | ORAL | 2 refills | Status: DC
Start: 2016-04-28 — End: 2016-05-15

## 2016-05-15 ENCOUNTER — Ambulatory Visit
Admit: 2016-05-15 | Discharge: 2016-05-15 | Payer: PRIVATE HEALTH INSURANCE | Attending: Family Medicine | Primary: Family

## 2016-05-15 DIAGNOSIS — K6289 Other specified diseases of anus and rectum: Secondary | ICD-10-CM

## 2016-05-15 MED ORDER — POLYETHYLENE GLYCOL 3350 17 GRAM (100 %) ORAL POWDER PACKET
17 gram | PACK | Freq: Every day | ORAL | 0 refills | Status: DC | PRN
Start: 2016-05-15 — End: 2016-06-13

## 2016-05-15 MED ORDER — BENZONATATE 100 MG CAP
100 mg | ORAL_CAPSULE | Freq: Three times a day (TID) | ORAL | 0 refills | Status: AC | PRN
Start: 2016-05-15 — End: 2016-05-22

## 2016-05-15 MED ORDER — DOCUSATE SODIUM 100 MG CAP
100 mg | ORAL_CAPSULE | Freq: Two times a day (BID) | ORAL | 2 refills | Status: AC
Start: 2016-05-15 — End: 2016-08-13

## 2016-05-15 MED ORDER — HYDROCORTISONE ACETATE 25 MG RECTAL SUPPOSITORY
25 mg | Freq: Two times a day (BID) | RECTAL | 0 refills | Status: DC
Start: 2016-05-15 — End: 2017-03-23

## 2016-05-15 NOTE — Progress Notes (Signed)
Levi [name] [facility] Note      Subjective:     Chief Complaint   Patient presents with   ??? Hemorrhoids     x 2 weeks, some bleeding when having bowel movements. Patient states that he is also straining when going to the restroom. Patient used preparation H without any relief.   ??? Cough     x 3 weeks non productive     Levi [name] is a 35 y.o. year old male who presents for evaluation of the following:    Hemorrhoids/ Anal Pain:   Chronic concern. Pain has never been this bad, worsened over past 2 weeks.   - Taking preparation H and otc pain pill with some relief  - BM daily, straining/ pain with defecation, blood in toilet and tissue  - Does not take anything for constipation  - Denies history of anal fissure, recent medication changes.   - Endorses anal sex but no different partner or instrumentation.       Cough: Unknown time course since "summer" cold that improved but persistent dry cough- feels he need to clear his throat. Irritation in throat. Has history of allergies. No current rhinorrhea or facial pressure, fever, chills, hemoptysis, chest pain. States CD4 count is normal and unchanged recently. No results on file since managed by outside facility.         Review of Systems   Pertinent positives and negative per HPI. All other systems  reviewed are negative for a Comprehensive ROS (10+).       Past Medical History:   Diagnosis Date   ??? Asthma     denies asthma   ??? Chronic allergic rhinitis    ??? Contact dermatitis and other eczema, due to unspecified cause    ??? GERD (gastroesophageal reflux disease)    ??? Headache 2010   ??? Hemorrhoids 07-2010   ??? HIV infection (HCC)    ??? Hypertension         Social History     Social History   ??? Marital status: SINGLE     Spouse name: N/A   ??? Number of children: N/A   ??? Years of education: N/A     Occupational History   ??? Not on file.     Social History Main Topics   ??? Smoking status: Never Smoker   ??? Smokeless tobacco: Never Used    ??? Alcohol use Yes      Comment: occasionally   ??? Drug use: No      Comment: occ, patient stated stopped smoking marijuana in Marth 2015   ??? Sexual activity: No     Other Topics Concern   ??? Not on file     Social History Narrative         Objective:     Vitals:    05/15/16 1259   BP: 118/82   Pulse: 86   Resp: 18   Temp: 98.2 ??F (36.8 ??C)   TempSrc: Oral   SpO2: 98%   Weight: 170 lb 6.4 oz (77.3 kg)   Height: 5\' 6"  (1.676 m)       Physical Examination:  General: Alert, cooperative, no distress, appears stated age.  Eyes: Conjunctivae/corneas clear. PERRL, EOMs intact.  Ears: Normal external ear canals both ears.  Nose: Nares normal. Septum midline. Mucosa normal. No drainage or sinus tenderness.  Mouth/Throat: Dry cough occasional during exam. Lips, mucosa, and tongue normal. Teeth and gums normal.  Neck: Supple, symmetrical, trachea  midline, no adenopathy. No thyroid enlargement/tenderness/nodules  Back: Symmetric, no curvature. ROM normal. No CVA tenderness.  Lungs: Clear to auscultation bilaterally. Normal inspiratory and expiratory ratio.   Heart: Regular rate and rhythm, S1, S2 normal, no murmur, click, rub or gallop.  Abdomen: Soft, non-tender. Bowel sounds normal. No masses or organomegaly.  Anal: Anoscopy performed in right lateral decubitus position. Several ~5-6 mm skin tags noted. No anal fissure or abscess noted. No inflamed hemorrhoid appreciated. No other mass of plaque appreciated. Exam limited by patient discomfort.   Extremities: Extremities normal, atraumatic, no cyanosis or edema.  Pulses: 2+ and symmetric all extremities.  Skin: Skin color, texture, turgor normal. No rashes or lesions  Lymph nodes: Cervical, supraclavicular nodes normal.  Neurologic: CNII-XII intact. Strength 5/5 grossly. Sensation and reflexes normal throughout.      Assessment/ Plan:   Diagnoses and all orders for this visit:    Anal pain/ Hemorrhoids, unspecified hemorrhoid type: No abscess or fissure on anoscopy.    - Try steroid supp and constipation regimen. Avoid anal intercourse until healed.   - GI referral if no improvement.  - hydrocortisone (ANUSOL-HC) 25 mg supp; Insert 1 Suppository into rectum every twelve (12) hours. Indications: HEMORRHOIDS  Dispense: 28 Suppository; Refill: 0  - docusate sodium (COLACE) 100 mg capsule; Take 1 Cap by mouth two (2) times a day for 90 days.  Dispense: 60 Cap; Refill: 2  - polyethylene glycol (MIRALAX) 17 gram packet; Take 1 Packet by mouth daily as needed.  Dispense: 30 Packet; Refill: 0    Cough: Likely lingering cough of acute viral bronchitis. Try antitussive. Advised patient follow up with ID as scheduled for routine CD4 count monitoring.   - benzonatate (TESSALON) 100 mg capsule; Take 1 Cap by mouth three (3) times daily as needed for Cough for up to 7 days.  Dispense: 30 Cap; Refill: 0    Other constipation: Contributing to hemorrhoid pain. Try regimen as above        I have discussed the diagnosis with the patient and the intended plan as seen in the above orders.  The patient has received an after-visit summary and questions were answered concerning future plans.  I have discussed medication side effects and warnings with the patient as well.           Follow-up Disposition:  Return if symptoms worsen or fail to improve.      Signed,    Vincente Poliakisha Blannie Shedlock, MD  05/15/2016

## 2016-05-15 NOTE — Addendum Note (Signed)
Addended by: Vincente Poli on: 05/15/2016 05:52 PM      Modules accepted: Orders

## 2016-05-15 NOTE — Patient Instructions (Signed)
Anal Pain: Care Instructions  Your Care Instructions  Pain in the opening to the rectum (anus) can be caused by diarrhea or constipation or by scratching a rectal itch. A common cause of anal pain is a tear in the lining of the lower rectum (anal fissure). This type of anal pain usually goes away when the problem clears up. Injury during anal sex or from an object being placed in the rectum also can cause pain. A rare cause of anal pain is spasms of the muscles in the rectum. Some of these conditions may cause some light bleeding.  Home treatment usually can relieve anal pain. If you continue to have anal pain, your doctor may prescribe medicine to relieve pain and other symptoms. Depending on the cause, you may need other treatment.  Follow-up care is a key part of your treatment and safety. Be sure to make and go to all appointments, and call your doctor if you are having problems. It's also a good idea to know your test results and keep a list of the medicines you take.  How can you care for yourself at home?  ?? Sit in a few inches of warm water (sitz bath) 3 times a day and after bowel movements. The warm water eases discomfort. Do not put soaps, salts, or shampoos in the water.  ?? Drink plenty of fluids, enough so that your urine is light yellow or clear like water. If you have kidney, heart, or liver disease and have to limit fluids, talk with your doctor before you increase the amount of fluids you drink.  ?? Include high-fiber foods, such as fruits, vegetables, beans, and whole grains, in your diet each day.  ?? Take a fiber supplement, such as Benefiber, Citrucel, or Metamucil, every day. Read and follow all instructions on the label.  ?? Use the toilet when you feel the urge. Or when you can, schedule time each day for a bowel movement. A daily routine may help. Take your time and do not strain when having a bowel movement. But do not sit on the toilet too long.   ?? Support your feet with a small step stool when you sit on the toilet. This helps flex your hips and places your pelvis in a squatting position.  ?? Your doctor may recommend an over-the-counter laxative, such as Miralax, Milk of Magnesia, or Ex-Lax. Read and follow all instructions on the label, and do not use laxatives on a long-term basis.  ?? Do not use over-the-counter ointments or creams without talking to your doctor. Some of these may not help.  ?? Use baby wipes or medicated pads, such as Preparation H or Tucks, instead of toilet paper to clean after a bowel movement. These products do not irritate the anus.  ?? Be safe with medicines. Read and follow all instructions on the label. If the doctor gave you a prescription medicine for pain, take it as prescribed. If you are not taking a prescription pain medicine, ask your doctor if you can take an over-the-counter medicine.  When should you call for help?  Call 911 anytime you think you may need emergency care. For example, call if:  ?? You passed out (lost consciousness).  Call your doctor now or seek immediate medical care if:  ?? You have a fever.  ?? You have swelling, a lump, a sore, or a new growth in or around your anus.  ?? Your stools are black and tarlike or have streaks of blood.  Watch closely for changes in your health, and be sure to contact your doctor if:  ?? You do not get better as expected.  Where can you learn more?  Go to InsuranceStats.cahttp://www.healthwise.net/GoodHelpConnections.  Enter B355 in the search box to learn more about "Anal Pain: Care Instructions."  Current as of: April 13, 2015  Content Version: 11.3  ?? 2006-2017 Healthwise, Incorporated. Care instructions adapted under license by [employer] (which disclaims liability or warranty for this information). If you have questions about a medical condition or this instruction, always ask your healthcare professional. Healthwise,  Incorporated disclaims any warranty or liability for your use of this information.       Hemorrhoids: Care Instructions  Your Care Instructions    Hemorrhoids are enlarged veins that develop in the anal canal. Bleeding during bowel movements, itching, swelling, and rectal pain are the most common symptoms. They can be uncomfortable at times, but hemorrhoids rarely are a serious problem.  You can treat most hemorrhoids with simple changes to your diet and bowel habits. These changes include eating more fiber and not straining to pass stools. Most hemorrhoids do not need surgery or other treatment unless they are very large and painful or bleed a lot.  Follow-up care is a key part of your treatment and safety. Be sure to make and go to all appointments, and call your doctor if you are having problems. It???s also a good idea to know your test results and keep a list of the medicines you take.  How can you care for yourself at home?  ?? Sit in a few inches of warm water (sitz bath) 3 times a day and after bowel movements. The warm water helps with pain and itching.  ?? Put ice on your anal area several times a day for 10 minutes at a time. Put a thin cloth between the ice and your skin. Follow this by placing a warm, wet towel on the area for another 10 to 20 minutes.  ?? Take pain medicines exactly as directed.  ?? If the doctor gave you a prescription medicine for pain, take it as prescribed.  ?? If you are not taking a prescription pain medicine, ask your doctor if you can take an over-the-counter medicine.  ?? Keep the anal area clean, but be gentle. Use water and a fragrance-free soap, such as [place]Ivory, or use baby wipes or medicated pads, such as Tucks.  ?? Wear cotton underwear and loose clothing to decrease moisture in the anal area.  ?? Eat more fiber. Include foods such as whole-grain breads and cereals, raw vegetables, raw and dried fruits, and beans.   ?? Drink plenty of fluids, enough so that your urine is light yellow or clear like water. If you have kidney, heart, or liver disease and have to limit fluids, talk with your doctor before you increase the amount of fluids you drink.  ?? Use a stool softener that contains bran or psyllium. You can save money by buying bran or psyllium (available in bulk at most health food stores) and sprinkling it on foods or stirring it into fruit juice. Or you can use a product such as Metamucil or Hydrocil.  ?? Practice healthy bowel habits.  ?? Go to the bathroom as soon as you have the urge.  ?? Avoid straining to pass stools. Relax and give yourself time to let things happen naturally.  ?? Do not hold your breath while passing stools.  ?? Do not read while  sitting on the toilet. Get off the toilet as soon as you have finished.  ?? Take your medicines exactly as prescribed. Call your doctor if you think you are having a problem with your medicine.  When should you call for help?  Call 911 anytime you think you may need emergency care. For example, call if:  ?? You pass maroon or very bloody stools.  Call your doctor now or seek immediate medical care if:  ?? You have increased pain.  ?? You have increased bleeding.  Watch closely for changes in your health, and be sure to contact your doctor if:  ?? Your symptoms have not improved after 3 or 4 days.  Where can you learn more?  Go to InsuranceStats.cahttp://www.healthwise.net/GoodHelpConnections.  Enter [phone] in the search box to learn more about "Hemorrhoids: Care Instructions."  Current as of: April 13, 2015  Content Version: 11.3  ?? 2006-2017 Healthwise, Incorporated. Care instructions adapted under license by [employer] (which disclaims liability or warranty for this information). If you have questions about a medical condition or this instruction, always ask your healthcare professional. Healthwise, Incorporated disclaims any warranty or liability for your use of this information.

## 2016-05-16 NOTE — Telephone Encounter (Signed)
Called patient in response to concern for continued anal pain. Patient with known hemorrhoid and stated typical hemorrhoid type pain. Severity is worse than usual in the past 2 weeks.     Patient states he was unable to fill the Anusol suppository. And tried Miralax twice with no relief. Patient asks if he can try a BC powder for pain.     Advised patient may try BC powder or other NSAID prn. He should increase miralax to bid and add benfiber or some other fiber supplement. He could try otc desitin as replacement for preparation H, which has not been helping much.    Patient expressed understanding and gratitude.     If no relief in 4 weeks will refer to GI for evaluation.      Vincente Poliakisha Tagen Milby, MD  05/16/2016

## 2016-05-26 ENCOUNTER — Encounter

## 2016-05-26 MED ORDER — LORATADINE 10 MG TAB
10 mg | ORAL_TABLET | ORAL | 3 refills | Status: DC
Start: 2016-05-26 — End: 2016-09-05

## 2016-05-30 NOTE — Telephone Encounter (Signed)
Hydrocortisone is not covered by Medicaid.

## 2016-06-07 ENCOUNTER — Encounter

## 2016-06-07 NOTE — Telephone Encounter (Signed)
-----   Message from Robert BellowSharkeisha M Clarke sent at 06/07/2016 11:53 AM EDT -----  Regarding: Np Sanderford/Telephone  Pt is requesting a call back do to blood still in his stool. Best contact number is [phone].

## 2016-06-07 NOTE — Telephone Encounter (Signed)
Discussed with patient. Gi referral provided.

## 2016-06-07 NOTE — Progress Notes (Signed)
Spoke to patient about persistent BRBPR. Chronic hemorrhoids. He denies lightheaded, CP, headache, weakness, fatigue. Encouraged to report to ED if they develop. Unimpressive hemorrhoid on previous exam an no frank blood on my previous exam. Will refer to GI for assistance eval and manage.     Vincente Poliakisha Guido Comp, MD  06/07/2016

## 2016-06-13 ENCOUNTER — Encounter

## 2016-06-13 MED ORDER — POLYETHYLENE GLYCOL 3350 17 GRAM (100 %) ORAL POWDER PACKET
17 gram | PACK | Freq: Every day | ORAL | 0 refills | Status: DC | PRN
Start: 2016-06-13 — End: 2016-07-19

## 2016-06-13 NOTE — Telephone Encounter (Signed)
-----   Message from Donovan KailVictor Haggar sent at 06/13/2016 10:01 AM EDT -----  Regarding: RE: Prescription Question  Contact: [phone]  [facility] on [address]     ----- Message -----  From: Fredrich BirksBarbara A Skyelar Halliday, LPN  Sent: 09/[birth year]09/06/20, 9:56 AM  To: Donovan KailVictor Wissmann  Subject: RE: Prescription Question    Which pharmacy do you want it sent to and we can send it in   Thanks  Barb    ----- Message -----     From: Donovan KailVictor Rushlow     Sent: 06/13/2016  9:45 AM EDT       To: Laurence SpatesSoeria Sanderford, NP  Subject: Prescription Question    I need a refill for POLYETHYLENE GLYCOL Can u give me a call?

## 2016-07-14 ENCOUNTER — Encounter

## 2016-07-14 MED ORDER — PROPRANOLOL 60 MG TAB
60 mg | ORAL_TABLET | ORAL | 2 refills | Status: DC
Start: 2016-07-14 — End: 2016-07-15

## 2016-07-15 ENCOUNTER — Ambulatory Visit
Admit: 2016-07-15 | Discharge: 2016-07-17 | Payer: PRIVATE HEALTH INSURANCE | Attending: Family Medicine | Primary: Family

## 2016-07-15 DIAGNOSIS — R369 Urethral discharge, unspecified: Secondary | ICD-10-CM

## 2016-07-15 MED ORDER — AZITHROMYCIN 500 MG TAB
500 mg | ORAL_TABLET | Freq: Once | ORAL | 0 refills | Status: AC
Start: 2016-07-15 — End: 2016-07-15

## 2016-07-15 MED ORDER — CEFIXIME 400 MG CAPSULE
400 mg | ORAL_CAPSULE | Freq: Once | ORAL | 0 refills | Status: AC
Start: 2016-07-15 — End: 2016-07-15

## 2016-07-15 NOTE — Patient Instructions (Signed)
Hearing Loss: Care Instructions  Your Care Instructions    Hearing loss is a sudden or slow decrease in how well you hear. It can range from mild to severe. Permanent hearing loss can occur with aging. It also can happen when you are exposed long-term to loud noise. Examples include listening to loud music, riding motorcycles, or being around other loud machines.  Hearing loss can affect your work and home life. It can make you feel lonely or depressed. You may feel that you have lost your independence. But hearing aids and other devices can help you hear better and feel connected to others.  Follow-up care is a key part of your treatment and safety. Be sure to make and go to all appointments, and call your doctor if you are having problems. It's also a good idea to know your test results and keep a list of the medicines you take.  How can you care for yourself at home?  ?? Avoid loud noises whenever possible. This helps keep your hearing from getting worse.  ?? Always wear hearing protection around loud noises.  ?? Wear a hearing aid as directed. See a person who can help you pick a hearing aid that fits you.  ?? Have hearing tests as your doctor suggests. They can show whether your hearing has changed. Your hearing aid may need to be adjusted.  ?? Use other devices as needed. These may include:  ?? Telephone amplifiers and hearing aids that can connect to a television, stereo, radio, or microphone.  ?? Devices that use lights or vibrations. These alert you to the doorbell, a ringing telephone, or a baby monitor.  ?? Television closed-captioning. This shows the words at the bottom of the screen. Most new TVs can do this.  ?? TTY (text telephone). This lets you type messages back and forth on the telephone instead of talking or listening. These devices are also called TDD. When messages are typed on the keyboard, they are sent over the phone line to a receiving TTY. The message is shown on a monitor.   ?? Use pagers, fax machines, and email if it is hard for you to communicate by telephone.  ?? Try to learn a listening technique called speech-reading. It is not lip-reading. You pay attention to people's gestures, expressions, posture, and tone of voice. These clues can help you understand what a person is saying. Face the person you are talking to, and have him or her face you. Make sure the lighting is good. You need to see the other person's face clearly.  ?? Think about counseling if you need help to adjust to your hearing loss.  When should you call for help?  Watch closely for changes in your health, and be sure to contact your doctor if:  ? ?? You think your hearing is getting worse.   ? ?? You have new symptoms, such as dizziness or nausea.   Where can you learn more?  Go to http://www.healthwise.net/GoodHelpConnections.  Enter R798 in the search box to learn more about "Hearing Loss: Care Instructions."  Current as of: Jan 14, 2016  Content Version: 11.4  ?? 2006-2017 Healthwise, Incorporated. Care instructions adapted under license by [employer] (which disclaims liability or warranty for this information). If you have questions about a medical condition or this instruction, always ask your healthcare professional. Healthwise, Incorporated disclaims any warranty or liability for your use of this information.

## 2016-07-15 NOTE — Progress Notes (Signed)
Chief Complaint   Patient presents with   ??? Ear Fullness     left ear     1. Have you been to the ER, urgent care clinic since your last visit?  Hospitalized since your last visit?No    2. Have you seen or consulted any other health care providers outside of the [facility] since your last visit?  Include any pap smears or colon screening. No

## 2016-07-15 NOTE — Progress Notes (Signed)
Patient Name: Levi [name]   MRN: [id number]    SUBJECTIVE  Levi [name] is a 35 y.o. male who presents with the following:     Reports decreased hearing and fullness of Le ear for one month. Has been taking Claritin and Flonase every day without relief. Denies fevers, discharge, nasal congestion, or URI symptoms.    Also reports one day hx of penile discharge; denies rashes. 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 Was told that he had an STD last year and was treated with an IM shot.   States that his HIV VL and CD4 counts have been good and he is compliant with his HIV meds ([facility]).      Review of Systems   Constitutional: Negative for fever, malaise/fatigue and weight loss.   HENT: Positive for hearing loss. Negative for ear discharge, ear pain and tinnitus.    Respiratory: Negative for cough, hemoptysis, shortness of breath and wheezing.    Cardiovascular: Negative for chest pain, palpitations, leg swelling and PND.   Gastrointestinal: Negative for abdominal pain, constipation, diarrhea, nausea and vomiting.   Genitourinary: Negative for dysuria, flank pain, frequency, hematuria and urgency.       The patient's medications, allergies, past medical history, surgical history, family history and social history were reviewed and updated where appropriate.      Prior to Admission medications    Medication Sig Start Date End Date Taking? Authorizing Provider   polyethylene glycol (MIRALAX) 17 gram packet Take 1 Packet by mouth daily as needed. 06/13/16  Yes Laurence SpatesSoeria Sanderford, NP   loratadine (CLARITIN) 10 mg tablet TAKE 1 TABLET BY MOUTH DAILY AS NEEDED FOR ALLERGIES 05/26/16  Yes Laurence SpatesSoeria Sanderford, NP   hydrocortisone (ANUSOL-HC) 25 mg supp Insert 1 Suppository into rectum every twelve (12) hours. Indications: HEMORRHOIDS 05/15/16  Yes Vincente Poliakisha Robinson, MD   docusate sodium (COLACE) 100 mg capsule Take 1 Cap by mouth two (2) times  a day for 90 days. 05/15/16 08/13/16 Yes Vincente Poliakisha Robinson, MD   TRIUMEQ tablet Take 1 Tab by mouth daily. 12/06/15  Yes Historical Provider   propranolol (INDERAL) 60 mg tablet Take 1 Tab by mouth two (2) times a day. 12/20/15  Yes Soeria Sanderford, NP   ketoconazole (NIZORAL) 2 % topical cream Apply  to affected area. 11/29/15  Yes Historical Provider   ergocalciferol (ERGOCALCIFEROL) 50,000 unit capsule Take 50,000 Units by mouth daily. 03/22/15  Yes Historical Provider   fluticasone (FLONASE) 50 mcg/actuation nasal spray 2 sprays each nostril BID for 3 days then 2 sprays each nostril daily for additional 11 days. 04/14/15  Yes Orpah Melteratherine Varney, DO       No Known Allergies        OBJECTIVE    Visit Vitals   ??? BP 132/84 (BP 1 Location: Left arm, BP Patient Position: Sitting)   ??? Pulse 95   ??? Temp 98.5 ??F (36.9 ??C) (Oral)   ??? Resp 18   ??? Ht 5\' 6"  (1.676 m)   ??? Wt 169 lb (76.7 kg)   ??? SpO2 98%   ??? BMI 27.28 kg/m2       Physical Exam   Constitutional: He is oriented to person, place, and time and well-developed, well-nourished, and in no distress. No distress.   HENT:   Head: Normocephalic and atraumatic.   Right Ear: Tympanic membrane is not perforated and not erythematous. No middle ear effusion. No decreased hearing is noted.   Left Ear: Tympanic membrane  is not perforated and not erythematous.  No middle ear effusion. No decreased hearing is noted.   Nose: Nose normal. Right sinus exhibits no maxillary sinus tenderness and no frontal sinus tenderness. Left sinus exhibits no maxillary sinus tenderness and no frontal sinus tenderness.   Mouth/Throat: Uvula is midline, oropharynx is clear and moist and mucous membranes are normal.   Neck: Normal range of motion. Neck supple.   Lymphadenopathy:     He has no cervical adenopathy.   Neurological: He is alert and oriented to person, place, and time.   Skin: He is not diaphoretic.   Psychiatric: Mood, memory, affect and judgment normal.   Nursing note and vitals reviewed.         ASSESSMENT AND PLAN  Levi [name] is a 35 y.o. male who presents today for:    1. Hearing loss of left ear, unspecified hearing loss type  Possible eustachian tube dysfunction or primary hearing loss. Exam appears normal today. Pt has been taking allergy medication w/o relief. Will refer to ENT.  - REFERRAL TO ENT-OTOLARYNGOLOGY    2. Penile discharge  Will empirically cover for GC/C. Pt declined IM medications and blood work. Will elect for oral therapy.  Reviewed signs and symptoms that would indicate a worsening medical condition which would require immediate evaluation and treatment; patient expressed understanding of plan.  - CHLAMYDIA/GC PCR  - CULTURE, URINE  - cefixime (SUPRAX) 400 mg capsule; Take 1 Cap by mouth once for 1 dose.  Dispense: 1 Cap; Refill: 0  - azithromycin (ZITHROMAX) 500 mg tab; Take 2 Tabs by mouth once for 1 dose.  Dispense: 2 Tab; Refill: 0       Medications Discontinued During This Encounter   Medication Reason   ??? propranolol (INDERAL) 60 mg tablet Duplicate Order       Follow-up Disposition:  Return if symptoms worsen or fail to improve.    Medication risks/benefits/costs/interactions/alternatives discussed with patient.  Advised patient to call back or return to office if symptoms worsen/change/persist. If patient cannot reach us or should anything more severe/urgent arise he/she should proceed directly to the nearest emergency department.  Discussed expected course/resolution/complications of diagnosis in detail with patient.  Patient given a written after visit summary which includes his/her diagnoses, current medications and vitals.  Patient expressed understanding with the diagnosis and plan.     Emarion Toral Nguyen-Cao M.D.

## 2016-07-17 LAB — CULTURE, URINE
Urine Culture, Routine: NO GROWTH
Urine Culture, Routine: NO GROWTH

## 2016-07-18 LAB — CHLAMYDIA/GC PCR
Chlamydia trachomatis, NAA: NEGATIVE
Neisseria gonorrhoeae, NAA: NEGATIVE

## 2016-07-18 NOTE — Progress Notes (Signed)
Please notify patient regarding their test results:    Urine does not show gonorrhea/chlamydia infection or UTI. Would like update on pt's symptoms.

## 2016-07-19 ENCOUNTER — Encounter

## 2016-07-19 MED ORDER — POLYETHYLENE GLYCOL 3350 17 GRAM (100 %) ORAL POWDER PACKET
17 gram | PACK | Freq: Every day | ORAL | 5 refills | Status: DC | PRN
Start: 2016-07-19 — End: 2017-03-23

## 2016-07-19 NOTE — Telephone Encounter (Signed)
From: Levi [name] Mwangi  To: Laurence SpatesSoeria Sanderford, NP  Sent: 07/19/2016 1:17 PM EST  Subject:  Medication Renewal Request    Original  authorizing provider: Laurence SpatesSoeria Sanderford, NP    Levi [name]  [place] would like a refill of the following medications:  polyethylene  glycol (MIRALAX) 17 gram packet Laurence Spates[Soeria Sanderford, NP]    Preferred  [facility]acy: [phone] [id number] [place] - [address]    Comment:

## 2016-07-19 NOTE — Progress Notes (Signed)
Call to patient. dob verified informed patient of results. Patient understands. Patient states he is doing well and has no symptoms at this time

## 2016-09-05 ENCOUNTER — Encounter

## 2016-09-05 MED ORDER — LORATADINE 10 MG TAB
10 mg | ORAL_TABLET | ORAL | 1 refills | Status: DC
Start: 2016-09-05 — End: 2016-11-03

## 2016-10-06 ENCOUNTER — Ambulatory Visit: Admit: 2016-10-06 | Attending: Medical-Surgical | Primary: Family

## 2016-10-06 DIAGNOSIS — R369 Urethral discharge, unspecified: Secondary | ICD-10-CM

## 2016-10-06 MED ORDER — FLUTICASONE 50 MCG/ACTUATION NASAL SPRAY, SUSP
50 mcg/actuation | NASAL | 1 refills | Status: DC
Start: 2016-10-06 — End: 2017-01-05

## 2016-10-06 NOTE — Patient Instructions (Addendum)
Allergies: Care Instructions  Your Care Instructions    Allergies occur when your body's defense system (immune system) overreacts to certain substances. The immune system treats a harmless substance as if it were a harmful germ or virus. Many things can cause this overreaction, including pollens, medicine, food, dust, animal dander, and mold.  Allergies can be mild or severe. Mild allergies can be managed with home treatment. But medicine may be needed to prevent problems.  Managing your allergies is an important part of staying healthy. Your doctor may suggest that you have allergy testing to help find out what is causing your allergies. When you know what things trigger your symptoms, you can avoid them. This can prevent allergy symptoms and other health problems.  For severe allergies that cause reactions that affect your whole body (anaphylactic reactions), your doctor may prescribe a shot of epinephrine to carry with you in case you have a severe reaction. Learn how to give yourself the shot and keep it with you at all times. Make sure it is not expired.  Follow-up care is a key part of your treatment and safety. Be sure to make and go to all appointments, and call your doctor if you are having problems. It's also a good idea to know your test results and keep a list of the medicines you take.  How can you care for yourself at home?  ?? If you have been told by your doctor that dust or dust mites are causing your allergy, decrease the dust around your bed:  ?? Wash sheets, pillowcases, and other bedding in hot water every week.  ?? Use dust-proof covers for pillows, duvets, and mattresses. Avoid plastic covers because they tear easily and do not "breathe." Wash as instructed on the label.  ?? Do not use any blankets and pillows that you do not need.  ?? Use blankets that you can wash in your washing machine.  ?? Consider removing drapes and carpets, which attract and hold dust, from your bedroom.   ?? If you are allergic to house dust and mites, do not use home humidifiers. Your doctor can suggest ways you can control dust and mites.  ?? Look for signs of cockroaches. Cockroaches cause allergic reactions. Use cockroach baits to get rid of them. Then, clean your home well. Cockroaches like areas where grocery bags, newspapers, empty bottles, or cardboard boxes are stored. Do not keep these inside your home, and keep trash and food containers sealed. Seal off any spots where cockroaches might enter your home.  ?? If you are allergic to mold, get rid of furniture, rugs, and drapes that smell musty. Check for mold in the bathroom.  ?? If you are allergic to outdoor pollen or mold spores, use air-conditioning. Change or clean all filters every month. Keep windows closed.  ?? If you are allergic to pollen, stay inside when pollen counts are high. Use a vacuum cleaner with a HEPA filter or a double-thickness filter at least two times each week.  ?? Stay inside when air pollution is bad. Avoid paint fumes, perfumes, and other strong odors.  ?? Avoid conditions that make your allergies worse. Stay away from smoke. Do not smoke or let anyone else smoke in your house. Do not use fireplaces or wood-burning stoves.  ?? If you are allergic to your pets, change the air filter in your furnace every month. Use high-efficiency filters.  ?? If you are allergic to pet dander, keep pets outside or out of   your bedroom. Old carpet and cloth furniture can hold a lot of animal dander. You may need to replace them.  When should you call for help?  Give an epinephrine shot if:  ? ?? You think you are having a severe allergic reaction.   ? ?? You have symptoms in more than one body area, such as mild nausea and an itchy mouth.   ?After giving an epinephrine shot call 911, even if you feel better.  ?Call 911 if:  ? ?? You have symptoms of a severe allergic reaction. These may include:  ?? Sudden raised, red areas (hives) all over your body.   ?? Swelling of the throat, mouth, lips, or tongue.  ?? Trouble breathing.  ?? Passing out (losing consciousness). Or you may feel very lightheaded or suddenly feel weak, confused, or restless.   ? ?? You have been given an epinephrine shot, even if you feel better.   ?Call your doctor now or seek immediate medical care if:  ? ?? You have symptoms of an allergic reaction, such as:  ?? A rash or hives (raised, red areas on the skin).  ?? Itching.  ?? Swelling.  ?? Belly pain, nausea, or vomiting.   ?Watch closely for changes in your health, and be sure to contact your doctor if:  ? ?? You do not get better as expected.   Where can you learn more?  Go to http://www.healthwise.net/[employer]Help[employer]ctions.  Enter W171 in the search box to learn more about "Allergies: Care Instructions."  Current as of: June 03, 2015  Content Version: 11.4  ?? 2006-2017 [employer]. Care instructions adapted under license by [employer] (which disclaims liability or warranty for this information). If you have questions about a medical condition or this instruction, always ask your healthcare professional. Healthwise, Incorporated disclaims any warranty or liability for your use of this information.

## 2016-10-06 NOTE — Assessment & Plan Note (Signed)
Stable, based on history, physical exam and review of pertinent labs, studies and medications; meds reconciled; continue current treatment plan., This condition is managed by Specialist.  Key Infectious Disease Meds             TRIUMEQ tablet  (Taking) Take 1 Tab by mouth daily.    ketoconazole (NIZORAL) 2 % topical cream  (Taking) Apply  to affected area.        Lab Results   Component Value Date/Time    Creatinine 1.16 12/20/2015 10:41 AM    BUN 13 12/20/2015 10:41 AM

## 2016-10-06 NOTE — Progress Notes (Signed)
Assessment/Plan:     Diagnoses and all orders for this visit:    1. Penile discharge  -     CT/NG/T.VAGINALIS AMPLIFICATION  Testing pending.  Treatment will be based on results.     2. Sinus pressure  -     fluticasone (FLONASE) 50 mcg/actuation nasal spray; 2 sprays each nostril BID for 3 days then 2 sprays each nostril daily for additional 11 days.  Stable.  Refilled today.     3. HIV positive (HCC)  Assessment & Plan:  Stable, based on history, physical exam and review of pertinent labs, studies and medications; meds reconciled; continue current treatment plan., This condition is managed by Specialist.  Key Infectious Disease Meds             TRIUMEQ tablet  (Taking) Take 1 Tab by mouth daily.    ketoconazole (NIZORAL) 2 % topical cream  (Taking) Apply  to affected area.        Lab Results   Component Value Date/Time    Creatinine 1.16 12/20/2015 10:41 AM    BUN 13 12/20/2015 10:41 AM           Follow-up Disposition:  Return if symptoms worsen or fail to improve.    Discussed expected course/resolution/complications of diagnosis in detail with patient. ??  Medication risks/benefits/costs/interactions/alternatives discussed with patient. ??  Pt was given after visit summary which includes diagnoses, current medications & vitals.   Pt expressed understanding with the diagnosis and plan          Subjective:      Levi [name] is a 36 y.o. male who presents for had concerns including Penile Discharge and Medication Evaluation.       Levi [name] is a 36 y.o. male who presents with concerns about sexually transmitted disease.  Current symptoms are: urethral discharge: scant, yellow and mucoid.  Onset of symptoms was abrupt and 7 days ago, and has been stable since that time.   Sexual history reviewed with the patient.  STD exposure: multiple sexual partners.  Previous STD history: HIV  HIV Status: the patient is HIV positive.    Contraception: condoms but reports condom broke prior to the onset of symptoms.          Current Outpatient Prescriptions   Medication Sig Dispense Refill   ??? fluticasone (FLONASE) 50 mcg/actuation nasal spray 2 sprays each nostril BID for 3 days then 2 sprays each nostril daily for additional 11 days. 1 Bottle 1   ??? loratadine (CLARITIN) 10 mg tablet TAKE 1 TABLET BY MOUTH DAILY AS NEEDED FOR ALLERGIES 30 Tab 1   ??? polyethylene glycol (MIRALAX) 17 gram packet Take 1 Packet by mouth daily as needed. 30 Packet 5   ??? hydrocortisone (ANUSOL-HC) 25 mg supp Insert 1 Suppository into rectum every twelve (12) hours. Indications: HEMORRHOIDS 28 Suppository 0   ??? TRIUMEQ tablet Take 1 Tab by mouth daily.     ??? propranolol (INDERAL) 60 mg tablet Take 1 Tab by mouth two (2) times a day. 60 Tab 0   ??? ketoconazole (NIZORAL) 2 % topical cream Apply  to affected area.     ??? ergocalciferol (ERGOCALCIFEROL) 50,000 unit capsule Take 50,000 Units by mouth daily.         No Known Allergies    ROS:   Complete review of systems was reviewed with pertinent information listed in HPI.    Objective:     Visit Vitals   ??? BP 118/82 (BP 1 Location:  Left arm, BP Patient Position: Sitting)  Comment: Manual   ??? Pulse 100   ??? Temp 98.6 ??F (37 ??C) (Oral)   ??? Resp 14   ??? Ht 5\' 6"  (1.676 m)   ??? Wt 163 lb 4 oz (74 kg)   ??? SpO2 96%   ??? BMI 26.35 kg/m2       Vitals and Nurse Documentation reviewed.     Physical Exam   Constitutional: No distress.   Cardiovascular: S1 normal and S2 normal.  Exam reveals no gallop and no friction rub.    No murmur heard.  Pulmonary/Chest: Breath sounds normal. No respiratory distress.   Psychiatric: Mood normal.

## 2016-10-06 NOTE — Progress Notes (Signed)
Chief Complaint   Patient presents with   ??? Penile Discharge     goey, yellow discharge x 7 days-denies pain   ??? Medication Evaluation     1. Have you been to the ER, urgent care clinic since your last visit?  Hospitalized since your last visit?No    2. Have you seen or consulted any other health care providers outside of the [facility] since your last visit?  Include any pap smears or colon screening. No

## 2016-10-10 LAB — CT/NG/T.VAGINALIS AMPLIFICATION
C. trachomatis by NAA: NEGATIVE
N. gonorrhoeae by NAA: NEGATIVE
T. vaginalis by NAA: NEGATIVE

## 2016-10-10 NOTE — Progress Notes (Signed)
No additional comment

## 2016-10-12 ENCOUNTER — Encounter

## 2016-10-13 MED ORDER — PROPRANOLOL 60 MG TAB
60 mg | ORAL_TABLET | ORAL | 0 refills | Status: DC
Start: 2016-10-13 — End: 2016-11-03

## 2016-10-13 NOTE — Telephone Encounter (Signed)
[facility]ley [facility], he states that he spoke with the patient about a week ago and believes that the patient was exposed to syphilis. Elige RadonBradley states that the patient saud he would come to his PCP or physician that he saw for the STD testing for preventative treatment and is concerned that he has not yet made an appointment. He understands from what the patient told him that he is negative for everything, but for prevention reasons, he would like him to get a preventative medication.     Best call back # for BelfordBradley: [phone]

## 2016-10-13 NOTE — Telephone Encounter (Signed)
We did not check for syphilis as he did not disclose this.  He can return for retesting or we can treat prophylactic ly.

## 2016-10-13 NOTE — Telephone Encounter (Signed)
Last visit 10/06/16

## 2016-10-14 NOTE — Telephone Encounter (Signed)
-----   Message from Vella RaringNakisha D McLean sent at 10/14/2016  1:22 PM EST -----  Regarding: NP Mary Holsinger/telephone  Pt received a call and is returning the call. Pt would like a call back. Pt can be reached at [phone].

## 2016-10-14 NOTE — Telephone Encounter (Signed)
Left message for patient to call back office

## 2016-10-16 NOTE — Telephone Encounter (Signed)
Patient was contacted and notified of note below. Patient stated he has an appointment Wednesday at 11:30 and would like to get the lab drawn then.

## 2016-10-18 ENCOUNTER — Ambulatory Visit
Admit: 2016-10-18 | Discharge: 2016-10-18 | Payer: PRIVATE HEALTH INSURANCE | Attending: Medical-Surgical | Primary: Family

## 2016-10-18 DIAGNOSIS — Z202 Contact with and (suspected) exposure to infections with a predominantly sexual mode of transmission: Secondary | ICD-10-CM

## 2016-10-18 NOTE — Progress Notes (Signed)
Assessment/Plan:     Diagnoses and all orders for this visit:    1. Exposure to STD    Advised prophylaxis and he declines at this time.  Discussed risks of untreated Syphilis and patient states understanding.  Health Department was notified and patient's HIV provider also notified.  He will follow up with [facility] or the Health Department and given that contact information today if he decides to undergo prophylaxis.     Follow-up Disposition:  Return if symptoms worsen or fail to improve.    Discussed expected course/resolution/complications of diagnosis in detail with patient. ??  Medication risks/benefits/costs/interactions/alternatives discussed with patient. ??  Pt was given after visit summary which includes diagnoses, current medications & vitals.   Pt expressed understanding with the diagnosis and plan          Subjective:      Levi [name] is a 36 y.o. male who presents for had concerns including Follow-up and Exposure to STD.     Received a call from the health department for Syphilis testing and treatment and he follows up with me today regarding this.  A recent partner has testing positive for Syphilis and this was reported.  He was contacted and advised to complete prophylactic treatment.  Known history of well controlled HIV with management by [facility], [facility] NP.     He continues to have penile discharge.  Recent STD screening for trich, GC, Chlamydia was negative.  Previously symptoms were prophylaxed for STD without improvement.  Urine culture was negative.  He has been referred to urology for further evaluation.  He has not completed referral at this time.     Current Outpatient Prescriptions   Medication Sig Dispense Refill   ??? propranolol (INDERAL) 60 mg tablet TAKE 1 TABLET BY MOUTH TWICE A DAY 60 Tab 0   ??? fluticasone (FLONASE) 50 mcg/actuation nasal spray 2 sprays each nostril BID for 3 days then 2 sprays each nostril daily for additional 11 days. 1 Bottle 1    ??? loratadine (CLARITIN) 10 mg tablet TAKE 1 TABLET BY MOUTH DAILY AS NEEDED FOR ALLERGIES 30 Tab 1   ??? polyethylene glycol (MIRALAX) 17 gram packet Take 1 Packet by mouth daily as needed. 30 Packet 5   ??? hydrocortisone (ANUSOL-HC) 25 mg supp Insert 1 Suppository into rectum every twelve (12) hours. Indications: HEMORRHOIDS 28 Suppository 0   ??? TRIUMEQ tablet Take 1 Tab by mouth daily.     ??? ketoconazole (NIZORAL) 2 % topical cream Apply  to affected area.     ??? ergocalciferol (ERGOCALCIFEROL) 50,000 unit capsule Take 50,000 Units by mouth daily.         No Known Allergies    ROS:   Complete review of systems was reviewed with pertinent information listed in HPI.    Objective:     Visit Vitals   ??? BP (!) 146/91 (BP 1 Location: Left arm, BP Patient Position: Sitting)   ??? Pulse 75   ??? Temp 98 ??F (36.7 ??C) (Oral)   ??? Resp 18   ??? Ht 5\' 6"  (1.676 m)   ??? Wt 160 lb (72.6 kg)   ??? SpO2 100%   ??? BMI 25.82 kg/m2       Vitals and Nurse Documentation reviewed.     Physical Exam   Constitutional: No distress.   Cardiovascular: S1 normal and S2 normal.  Exam reveals no gallop and no friction rub.    No murmur heard.  Pulmonary/Chest: Breath  sounds normal. No respiratory distress.   Psychiatric: Mood normal.

## 2016-11-03 ENCOUNTER — Encounter

## 2016-11-03 MED ORDER — LORATADINE 10 MG TAB
10 mg | ORAL_TABLET | ORAL | 0 refills | Status: DC
Start: 2016-11-03 — End: 2016-12-04

## 2016-11-06 NOTE — Telephone Encounter (Signed)
Route to PCP

## 2016-11-07 MED ORDER — PROPRANOLOL 60 MG TAB
60 mg | ORAL_TABLET | ORAL | 3 refills | Status: DC
Start: 2016-11-07 — End: 2017-02-22

## 2016-12-04 ENCOUNTER — Encounter

## 2016-12-04 MED ORDER — LORATADINE 10 MG TAB
10 mg | ORAL_TABLET | ORAL | 2 refills | Status: DC
Start: 2016-12-04 — End: 2017-02-22

## 2017-01-05 ENCOUNTER — Encounter

## 2017-01-05 MED ORDER — FLUTICASONE 50 MCG/ACTUATION NASAL SPRAY, SUSP
50 mcg/actuation | NASAL | 1 refills | Status: DC
Start: 2017-01-05 — End: 2017-02-22

## 2017-02-22 ENCOUNTER — Encounter

## 2017-02-23 MED ORDER — FLUTICASONE 50 MCG/ACTUATION NASAL SPRAY, SUSP
50 mcg/actuation | NASAL | 3 refills | Status: DC
Start: 2017-02-23 — End: 2017-06-28

## 2017-02-23 MED ORDER — LORATADINE 10 MG TAB
10 mg | ORAL_TABLET | ORAL | 5 refills | Status: DC
Start: 2017-02-23 — End: 2017-08-23

## 2017-02-23 MED ORDER — PROPRANOLOL 60 MG TAB
60 mg | ORAL_TABLET | ORAL | 2 refills | Status: DC
Start: 2017-02-23 — End: 2017-05-28

## 2017-02-23 NOTE — Telephone Encounter (Signed)
Call to patient. dob verified. Informed patient he is overdue for cpe. He states he will call back. Informed I would let provider know

## 2017-02-23 NOTE — Telephone Encounter (Signed)
Due for CPE

## 2017-03-23 ENCOUNTER — Ambulatory Visit
Admit: 2017-03-23 | Discharge: 2017-03-23 | Payer: PRIVATE HEALTH INSURANCE | Attending: Family Medicine | Primary: Family

## 2017-03-23 DIAGNOSIS — K649 Unspecified hemorrhoids: Secondary | ICD-10-CM

## 2017-03-23 MED ORDER — HYDROCORTISONE ACETATE 25 MG RECTAL SUPPOSITORY
25 mg | Freq: Two times a day (BID) | RECTAL | 0 refills | Status: DC
Start: 2017-03-23 — End: 2020-12-29

## 2017-03-23 MED ORDER — HYDROXYZINE 25 MG TAB
25 mg | ORAL_TABLET | Freq: Two times a day (BID) | ORAL | 0 refills | Status: AC | PRN
Start: 2017-03-23 — End: 2017-04-02

## 2017-03-23 NOTE — Progress Notes (Signed)
Levi [name]      Subjective:     Chief Complaint   Patient presents with   ??? Hemorrhoids     x 2 months. Patient has been using preparation H and the wipes. Patient is not having difficulty using the bathroom. Just discomfort in the anal area.     Levi [name] is a 36 y.o. year old male who presents for evaluation of the following:    Hemorrhoids/ Anal Pain:   Per previous visit  Chronic concern. Pain has never been this bad, worsened over past 2 weeks.   - Taking preparation H and otc pain pill with some relief  - BM daily, straining/ pain with defecation, blood in toilet and tissue  - Does not take anything for constipation  - Denies history of anal fissure, recent medication changes.   - Endorses anal sex but no different partner or instrumentation.     Interval Update  Recurred 2 months ago  Sx: itching  Bowel movement normal.  Tx: preparation H and wipes with no improvement.  Has seen GI with coloscopy since last visit assessment hemorrhoids + rectal ulcer  Denies pain      Elevated Blood Pressure Reading:   Wt up 11 lbs sicne last vist 10/2016        Review of Systems   Pertinent positives and negative per HPI. All other systems  reviewed are negative for a Comprehensive ROS (10+).       Past Medical History:   Diagnosis Date   ??? Asthma     denies asthma   ??? Chronic allergic rhinitis    ??? Contact dermatitis and other eczema, due to unspecified cause    ??? GERD (gastroesophageal reflux disease)    ??? Headache(784.0) 2010   ??? Hemorrhoids 07-2010   ??? HIV infection (HCC)    ??? Hypertension         Social History     Social History   ??? Marital status: SINGLE     Spouse name: N/A   ??? Number of children: N/A   ??? Years of education: N/A     Occupational History   ??? Not on file.     Social History Main Topics   ??? Smoking status: Never Smoker   ??? Smokeless tobacco: Never Used   ??? Alcohol use Yes      Comment: occasionally   ??? Drug use: No       Comment: occ, patient stated stopped smoking marijuana in Marth 2015   ??? Sexual activity: No     Other Topics Concern   ??? Not on file     Social History Narrative         Objective:     Vitals:    03/23/17 1253 03/23/17 1258   BP: (!) 131/107 (!) 126/99   Pulse: 98 84   Resp: 18    Temp: 97.6 ??F (36.4 ??C)    TempSrc: Oral    SpO2: 98%    Weight: 171 lb 9.6 oz (77.8 kg)    Height: 5\' 6"  (1.676 m)        Physical Examination:  General: Alert, cooperative, no distress, appears stated age.  Eyes: Conjunctivae/corneas clear. PERRL, EOMs intact.  Ears: Normal external ear canals both ears.  Nose: Nares normal. Septum midline. Mucosa normal. No drainage or sinus tenderness.  Mouth/Throat: Dry cough occasional during exam. Lips, mucosa, and tongue normal. Teeth and gums normal.  Neck: Supple,  symmetrical, trachea midline  Lungs: Clear to auscultation bilaterally. Normal inspiratory and expiratory ratio.   Heart: Regular rate and rhythm, S1, S2 normal, no murmur, click, rub or gallop.  Abdomen: Soft, non-tender.   Extremities: Extremities normal, atraumatic, no cyanosis or edema.  Pulses: 2+ and symmetric all extremities.  Skin: Skin color, texture, turgor normal. No rashes or lesions  Neurologic: CNII-XII intact. Gait steady and unassisted      Assessment/ Plan:   Diagnoses and all orders for this visit:    1. Hemorrhoids, unspecified hemorrhoid type  -     hydrocortisone (ANUSOL-HC) 25 mg supp; Insert 1 Suppository into rectum every twelve (12) hours. Indications: Hemorrhoids  -     hydrOXYzine HCl (ATARAX) 25 mg tablet; Take 1 Tab by mouth two (2) times daily as needed for Itching for up to 10 days.    2. Anal itching  -     hydrocortisone (ANUSOL-HC) 25 mg supp; Insert 1 Suppository into rectum every twelve (12) hours. Indications: Hemorrhoids  -     hydrOXYzine HCl (ATARAX) 25 mg tablet; Take 1 Tab by mouth two (2) times daily as needed for Itching for up to 10 days.    3. Elevated blood pressure reading       Persistent itching from hemorrhoid. Add steroid suppository, trial atrax and follow up with GI for surgical treatment options.      Mild BP elevated noted. Likely 2/2/ diet with recent 11 lb weight gainin 5 months. Advised patient adhere to low salt diet.       I have discussed the diagnosis with the patient and the intended plan as seen in the above orders.  The patient has received an after-visit summary and questions were answered concerning future plans.  I have discussed medication side effects and warnings with the patient as well.           Follow-up Disposition:  Return if symptoms worsen or fail to improve, for Follow Up.      Signed,    Vincente Poli, MD  03/23/2017  This [name] will not be viewable in MyChart.

## 2017-03-23 NOTE — Progress Notes (Signed)
Chief Complaint   Patient presents with   ??? Hemorrhoids     x 2 months. Patient has been using preparation H and the wipes. Patient is not having difficulty using the bathroom. Just discomfort in the anal area.     1. Have you been to the ER, urgent care clinic since your last visit?  Hospitalized since your last visit?No    2. Have you seen or consulted any other health care providers outside of the [facility] since your last visit?  Include any pap smears or colon screening. No

## 2017-03-23 NOTE — Patient Instructions (Addendum)
Anal Itching: Care Instructions  Your Care Instructions  Anal itching can be caused by allergic reactions, hemorrhoids, and other medical conditions. But most causes are not serious. Spicy foods, citrus fruit, caffeine, and alcohol can irritate the anal area and cause itching. Not cleaning the anal area well-or cleaning it too well by rubbing hard-also can cause itching.  Treatment at home can help ease itching.  Follow-up care is a key part of your treatment and safety. Be sure to make and go to all appointments, and call your doctor if you are having problems. It's also a good idea to know your test results and keep a list of the medicines you take.  How can you care for yourself at home?  ?? After bowel movements, clean the area gently with a warm washcloth or a towelette, such as a baby wipe. Do not use products that contain alcohol.  ?? If your doctor prescribes a cream or ointment, use it exactly as prescribed. Call your doctor if you think you are having a problem with your medicine.  ?? Avoid strong soaps that contain fragrance.  ?? Do not use scented or colored toilet paper.  ?? Put ice or a cold pack on the area for 10 to 20 minutes at a time. Put a thin cloth between the ice and your skin.  ?? Use zinc oxide, petroleum jelly, or 1% hydrocortisone cream on the area. Do not use anesthetic products with "-caine" at the end of the name without talking with your doctor first. Some people may be allergic to these products.  ?? Keep a food diary of what you eat. Certain foods may irritate your anal area after a bowel movement.  ?? Wear cotton underwear. Avoid pantyhose or other tight clothes.  When should you call for help?  Call your doctor now or seek immediate medical care if:  ?? ?? You have new or worse pain.   ?? ?? You have new or worse bleeding from the rectum.   ??Watch closely for changes in your health, and be sure to contact your doctor if:  ?? ?? You have trouble passing stools.    ?? ?? You do not get better as expected.   Where can you learn more?  Go to InsuranceStats.cahttp://www.healthwise.net/GoodHelpConnections.  Enter [phone] in the search box to learn more about "Anal Itching: Care Instructions."  Current as of: Jan 14, 2016  Content Version: 11.7  ?? 2006-2018 [employer]. Care instructions adapted under license by [employer] (which disclaims liability or warranty for this information). If you have questions about a medical condition or this instruction, always ask your healthcare professional. [employer], Inc[employer] disclaims any warranty or liability for your use of this information.       Hemorrhoids: Care Instructions  Your Care Instructions    Hemorrhoids are enlarged veins that develop in the anal canal. Bleeding during bowel movements, itching, swelling, and rectal pain are the most common symptoms. They can be uncomfortable at times, but hemorrhoids rarely are a serious problem.  You can treat most hemorrhoids with simple changes to your diet and bowel habits. These changes include eating more fiber and not straining to pass stools. Most hemorrhoids do not need surgery or other treatment unless they are very large and painful or bleed a lot.  Follow-up care is a key part of your treatment and safety. Be sure to make and go to all appointments, and call your doctor if you are having problems. It's also a  good idea to know your test results and keep a list of the medicines you take.  How can you care for yourself at home?  ?? Sit in a few inches of warm water (sitz bath) 3 times a day and after bowel movements. The warm water helps with pain and itching.  ?? Put ice on your anal area several times a day for 10 minutes at a time. Put a thin cloth between the ice and your skin. Follow this by placing a warm, wet towel on the area for another 10 to 20 minutes.  ?? Take pain medicines exactly as directed.  ?? If the doctor gave you a prescription medicine for pain, take it as  prescribed.  ?? If you are not taking a prescription pain medicine, ask your doctor if you can take an over-the-counter medicine.  ?? Keep the anal area clean, but be gentle. Use water and a fragrance-free soap, such as [place], or use baby wipes or medicated pads, such as Tucks.  ?? Wear cotton underwear and loose clothing to decrease moisture in the anal area.  ?? Eat more fiber. Include foods such as whole-grain breads and cereals, raw vegetables, raw and dried fruits, and beans.  ?? Drink plenty of fluids, enough so that your urine is light yellow or clear like water. If you have kidney, heart, or liver disease and have to limit fluids, talk with your doctor before you increase the amount of fluids you drink.  ?? Use a stool softener that contains bran or psyllium. You can save money by buying bran or psyllium (available in bulk at most health food stores) and sprinkling it on foods or stirring it into fruit juice. Or you can use a product such as Metamucil or Hydrocil.  ?? Practice healthy bowel habits.  ?? Go to the bathroom as soon as you have the urge.  ?? Avoid straining to pass stools. Relax and give yourself time to let things happen naturally.  ?? Do not hold your breath while passing stools.  ?? Do not read while sitting on the toilet. Get off the toilet as soon as you have finished.  ?? Take your medicines exactly as prescribed. Call your doctor if you think you are having a problem with your medicine.  When should you call for help?  Call 911 anytime you think you may need emergency care. For example, call if:  ?? ?? You pass maroon or very bloody stools.   ??Call your doctor now or seek immediate medical care if:  ?? ?? You have increased pain.   ?? ?? You have increased bleeding.   ??Watch closely for changes in your health, and be sure to contact your doctor if:  ?? ?? Your symptoms have not improved after 3 or 4 days.   Where can you learn more?  Go to InsuranceStats.ca.   Enter F228 in the search box to learn more about "Hemorrhoids: Care Instructions."  Current as of: Jan 14, 2016  Content Version: 11.7  ?? 2006-2018 [employer]. Care instructions adapted under license by [employer] (which disclaims liability or warranty for this information). If you have questions about a medical condition or this instruction, always ask your healthcare professional. [employer]wise, Inc[employer] disclaims any warranty or liability for your use of this information.       DASH Diet: Care Instructions  Your Care Instructions    The DASH diet is an eating plan that can help lower your blood pressure. DASH  stands for Dietary Approaches to Stop Hypertension. Hypertension is high blood pressure.  The DASH diet focuses on eating foods that are high in calcium, potassium, and magnesium. These nutrients can lower blood pressure. The foods that are highest in these nutrients are fruits, vegetables, low-fat dairy products, nuts, seeds, and legumes. But taking calcium, potassium, and magnesium supplements instead of eating foods that are high in those nutrients does not have the same effect. The DASH diet also includes whole grains, fish, and poultry.  The DASH diet is one of several lifestyle changes your doctor may recommend to lower your high blood pressure. Your doctor may also want you to decrease the amount of sodium in your diet. Lowering sodium while following the DASH diet can lower blood pressure even further than just the DASH diet alone.  Follow-up care is a key part of your treatment and safety. Be sure to make and go to all appointments, and call your doctor if you are having problems. It's also a good idea to know your test results and keep a list of the medicines you take.  How can you care for yourself at home?  Following the DASH diet  ?? Eat 4 to 5 servings of fruit each day. A serving is 1 medium-sized piece  of fruit, ?? cup chopped or canned fruit, 1/4 cup dried fruit, or 4 ounces (?? cup) of fruit juice. Choose fruit more often than fruit juice.  ?? Eat 4 to 5 servings of vegetables each day. A serving is 1 cup of lettuce or raw leafy vegetables, ?? cup of chopped or cooked vegetables, or 4 ounces (?? cup) of vegetable juice. Choose vegetables more often than vegetable juice.  ?? Get 2 to 3 servings of low-fat and fat-free dairy each day. A serving is 8 ounces of milk, 1 cup of yogurt, or 1 ?? ounces of cheese.  ?? Eat 6 to 8 servings of grains each day. A serving is 1 slice of bread, 1 ounce of dry cereal, or ?? cup of cooked rice, pasta, or cooked cereal. Try to choose whole-grain products as much as possible.  ?? Limit lean meat, poultry, and fish to 2 servings each day. A serving is 3 ounces, about the size of a deck of cards.  ?? Eat 4 to 5 servings of nuts, seeds, and legumes (cooked dried beans, lentils, and split peas) each week. A serving is 1/3 cup of nuts, 2 tablespoons of seeds, or ?? cup of cooked beans or peas.  ?? Limit fats and oils to 2 to 3 servings each day. A serving is 1 teaspoon of vegetable oil or 2 tablespoons of salad dressing.  ?? Limit sweets and added sugars to 5 servings or less a week. A serving is 1 tablespoon jelly or jam, ?? cup sorbet, or 1 cup of lemonade.  ?? Eat less than 2,300 milligrams (mg) of sodium a day. If you limit your sodium to 1,500 mg a day, you can lower your blood pressure even more.  Tips for success  ?? Start small. Do not try to make dramatic changes to your diet all at once. You might feel that you are missing out on your favorite foods and then be more likely to not follow the plan. Make small changes, and stick with them. Once those changes become habit, add a few more changes.  ?? Try some of the following:  ?? Make it a goal to eat a fruit or vegetable at every meal and  at snacks. This will make it easy to get the recommended amount of fruits and vegetables each day.   ?? Try yogurt topped with fruit and nuts for a snack or healthy dessert.  ?? Add lettuce, tomato, cucumber, and onion to sandwiches.  ?? Combine a ready-made pizza crust with low-fat mozzarella cheese and lots of vegetable toppings. Try using tomatoes, squash, spinach, broccoli, carrots, cauliflower, and onions.  ?? Have a variety of cut-up vegetables with a low-fat dip as an appetizer instead of chips and dip.  ?? Sprinkle sunflower seeds or chopped almonds over salads. Or try adding chopped walnuts or almonds to cooked vegetables.  ?? Try some vegetarian meals using beans and peas. Add garbanzo or kidney beans to salads. Make burritos and tacos with mashed pinto beans or black beans.  Where can you learn more?  Go to InsuranceStats.cahttp://www.healthwise.net/GoodHelpConnections.  Enter (412)400-6108967 in the search box to learn more about "DASH Diet: Care Instructions."  Current as of: August 09, 2016  Content Version: 11.7  ?? 2006-2018 [employer]wise, [employer]. Care instructions adapted under license by [employer] (which disclaims liability or warranty for this information). If you have questions about a medical condition or this instruction, always ask your healthcare professional. [employer]wise, [employer] disclaims any warranty or liability for your use of this information.

## 2017-03-26 NOTE — Telephone Encounter (Addendum)
PA initiated for Hydrocortisone Suppository.    Hydrocortisone Suppository was denied coverage.

## 2017-04-11 NOTE — Telephone Encounter (Signed)
Levi [name] is calling to invite Levi [name] for the Patients interdisciplinary care team meeting   Meeting date /Time : August 14th 12:30pm   Call back : [phone]

## 2017-05-28 ENCOUNTER — Encounter

## 2017-05-28 MED ORDER — PROPRANOLOL 60 MG TAB
60 mg | ORAL_TABLET | ORAL | 0 refills | Status: DC
Start: 2017-05-28 — End: 2017-06-28

## 2017-06-28 ENCOUNTER — Encounter

## 2017-06-29 MED ORDER — PROPRANOLOL 60 MG TAB
60 mg | ORAL_TABLET | ORAL | 0 refills | Status: DC
Start: 2017-06-29 — End: 2017-07-27

## 2017-07-04 MED ORDER — FLUTICASONE 50 MCG/ACTUATION NASAL SPRAY, SUSP
50 mcg/actuation | NASAL | 3 refills | Status: DC
Start: 2017-07-04 — End: 2017-10-18

## 2017-07-27 ENCOUNTER — Encounter

## 2017-07-27 MED ORDER — PROPRANOLOL 60 MG TAB
60 mg | ORAL_TABLET | ORAL | 0 refills | Status: DC
Start: 2017-07-27 — End: 2017-08-23

## 2017-07-27 NOTE — Telephone Encounter (Signed)
Last viist 03/23/17  Rx signed and sent to pharmacy per verbal order Dr Teola BradleyBarr

## 2017-08-23 ENCOUNTER — Encounter

## 2017-08-27 MED ORDER — LORATADINE 10 MG TAB
10 mg | ORAL_TABLET | ORAL | 5 refills | Status: DC
Start: 2017-08-27 — End: 2018-02-08

## 2017-08-27 MED ORDER — PROPRANOLOL 60 MG TAB
60 mg | ORAL_TABLET | ORAL | 0 refills | Status: DC
Start: 2017-08-27 — End: 2017-09-20

## 2017-09-20 ENCOUNTER — Encounter

## 2017-09-21 MED ORDER — PROPRANOLOL 60 MG TAB
60 mg | ORAL_TABLET | ORAL | 0 refills | Status: DC
Start: 2017-09-21 — End: 2017-10-18

## 2017-09-21 NOTE — Telephone Encounter (Signed)
Needs fasting office visit and blood work

## 2017-10-18 ENCOUNTER — Encounter

## 2017-10-18 MED ORDER — FLUTICASONE 50 MCG/ACTUATION NASAL SPRAY, SUSP
50 mcg/actuation | NASAL | 3 refills | Status: DC
Start: 2017-10-18 — End: 2018-02-08

## 2017-10-19 MED ORDER — PROPRANOLOL 60 MG TAB
60 mg | ORAL_TABLET | ORAL | 0 refills | Status: DC
Start: 2017-10-19 — End: 2017-11-16

## 2017-11-16 ENCOUNTER — Encounter

## 2017-11-16 MED ORDER — PROPRANOLOL 60 MG TAB
60 mg | ORAL_TABLET | ORAL | 0 refills | Status: DC
Start: 2017-11-16 — End: 2017-12-14

## 2017-11-16 NOTE — Telephone Encounter (Signed)
Needs fasting office visit and blood work

## 2017-12-14 ENCOUNTER — Encounter

## 2017-12-14 MED ORDER — PROPRANOLOL 60 MG TAB
60 mg | ORAL_TABLET | ORAL | 0 refills | Status: DC
Start: 2017-12-14 — End: 2018-01-10

## 2018-01-10 ENCOUNTER — Encounter

## 2018-01-11 MED ORDER — PROPRANOLOL 60 MG TAB
60 mg | ORAL_TABLET | ORAL | 0 refills | Status: DC
Start: 2018-01-11 — End: 2018-02-08

## 2018-01-11 NOTE — Telephone Encounter (Signed)
Needs fasting office visit and blood work

## 2018-02-08 ENCOUNTER — Encounter

## 2018-02-08 MED ORDER — PROPRANOLOL 60 MG TAB
60 mg | ORAL_TABLET | ORAL | 0 refills | Status: DC
Start: 2018-02-08 — End: 2018-03-01

## 2018-02-08 MED ORDER — LORATADINE 10 MG TAB
10 mg | ORAL_TABLET | ORAL | 5 refills | Status: DC
Start: 2018-02-08 — End: 2018-08-08

## 2018-02-11 MED ORDER — FLUTICASONE 50 MCG/ACTUATION NASAL SPRAY, SUSP
50 mcg/actuation | NASAL | 0 refills | Status: DC
Start: 2018-02-11 — End: 2020-12-29

## 2018-03-01 ENCOUNTER — Encounter

## 2018-03-01 MED ORDER — PROPRANOLOL 60 MG TAB
60 mg | ORAL_TABLET | ORAL | 0 refills | Status: DC
Start: 2018-03-01 — End: 2018-04-04

## 2018-04-04 ENCOUNTER — Encounter

## 2018-04-05 MED ORDER — PROPRANOLOL 60 MG TAB
60 mg | ORAL_TABLET | ORAL | 0 refills | Status: DC
Start: 2018-04-05 — End: 2018-05-03

## 2018-04-05 NOTE — Telephone Encounter (Signed)
Needs fasting office visit and blood work asap, will not fill next month

## 2018-04-11 ENCOUNTER — Ambulatory Visit: Admit: 2018-04-11 | Discharge: 2018-04-11 | Payer: PRIVATE HEALTH INSURANCE | Attending: Family | Primary: Family

## 2018-04-11 ENCOUNTER — Ambulatory Visit: Attending: Family | Primary: Family

## 2018-04-11 DIAGNOSIS — N342 Other urethritis: Secondary | ICD-10-CM

## 2018-04-11 MED ORDER — OLOPATADINE 0.1 % EYE DROPS
0.1 % | Freq: Two times a day (BID) | OPHTHALMIC | 2 refills | Status: DC
Start: 2018-04-11 — End: 2018-08-08

## 2018-04-11 MED ORDER — OLOPATADINE 0.1 % EYE DROPS
0.1 % | Freq: Two times a day (BID) | OPHTHALMIC | 2 refills | Status: DC
Start: 2018-04-11 — End: 2018-04-11

## 2018-04-11 MED ORDER — CEFTRIAXONE 250 MG SOLUTION FOR INJECTION
250 mg | Freq: Once | INTRAMUSCULAR | 0 refills | Status: AC
Start: 2018-04-11 — End: 2018-04-11

## 2018-04-11 MED ORDER — AZITHROMYCIN 500 MG TAB
500 mg | ORAL_TABLET | ORAL | 0 refills | Status: AC
Start: 2018-04-11 — End: 2018-04-11

## 2018-04-11 NOTE — Progress Notes (Signed)
Notified patient via my chart see my chart message from patient

## 2018-04-11 NOTE — Progress Notes (Signed)
Progress  Notes by Joretta Bacheloralbert, Yeraldi Fidler J at 04/11/18 1445                Author: Joretta Bacheloralbert, Kaedyn Polivka J  Service: --  Author Type: Nurse Practitioner       Filed: 04/13/18 1050  Encounter Date: 04/11/2018  Status: Signed          Editor: Juanito Doomalbert, Lynze Reddy J                        [facility]   Clinic Note      Levi [name] is a 37 y.o.  male who was seen in clinic today (04/11/2018).           Subjective:   STD Screening   Patient requests STD screening. Current symptoms are: dysuria and penile discharge. Onset of symptoms was abrupt and 2 days ago, and has been gradually worsening since that time. Sexual history reviewed with the patient. STD exposure: sexual contact with  individual with uncertain background 1 week ago. Previous STD history: GC, chlamydia, HIV. 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 Contraception: none      Skin Mass    Patient complains of a left testicular mass. The patient reports that the mass has been present for 2 week. The onset of the mass was sudden.    Associated symptoms include enlargement.    Patient denies pain, redness or discoloration of the skin.     Prior treatments, if any: none.        Prior to Admission medications             Medication  Sig  Start Date  End Date  Taking?  Authorizing Provider            olopatadine (PATANOL) 0.1 % ophthalmic solution  Administer 2 Drops to both eyes two (2) times a day.  04/11/18    Yes  Alissia Lory, Tollie Pizzaimothy J, NP     propranolol (INDERAL) 60 mg tablet  TAKE 1 TABLET BY MOUTH TWICE A DAY  04/05/18    Yes  Sanderford, Soeria, NP     fluticasone propionate (FLONASE) 50 mcg/actuation nasal spray  TAKE 2 SPRAY IN EACH NOSTRIL TWICE A DAY FOR 3 DAYS, THEN EVERY DAY FOR 11 DAYS  02/11/18    Yes  Masterson, Dennison NancyAivi N, MD     loratadine (CLARITIN) 10 mg tablet  TAKE 1 TABLET BY MOUTH EVERY DAY AS NEEDED FOR ALLERGIES  02/08/18    Yes  Sanderford, Soeria, NP     hydrocortisone (ANUSOL-HC) 25 mg supp  Insert 1 Suppository into rectum every  twelve (12) hours. Indications: Hemorrhoids  03/23/17    Yes  Vincente Poliobinson, Takisha, MD     TRIUMEQ tablet  Take 1 Tab by mouth daily.  12/06/15    Yes  Provider, Historical     ergocalciferol (ERGOCALCIFEROL) 50,000 unit capsule  Take 50,000 Units by mouth every thirty (30) days.  03/22/15    Yes  Provider, Historical            ketoconazole (NIZORAL) 2 % topical cream  Apply  to affected area.  11/29/15      Provider, Historical               No Known Allergies           ROS   See HPI      Objective:    Physical Exam    Constitutional: He is  oriented to person, place, and time. He appears well-developed and well-nourished.    Cardiovascular: Normal rate, regular rhythm and intact distal pulses. Exam reveals no gallop and no friction rub.    No murmur heard.   Pulmonary/Chest: Effort normal and breath sounds normal. No respiratory distress.   Genitourinary: Left testis shows swelling  (no discrete mass noted, mild swelling of testicle noted).   Neurological: He is alert and oriented to person, place, and time.   Psychiatric: He has a normal mood and affect. His speech is normal and behavior is normal. Thought content  normal.    Nursing note and vitals reviewed.            Visit Vitals      BP  (!) 129/92 (BP 1 Location: Left arm, BP Patient Position: Sitting)     Pulse  66     Temp  98 ??F (36.7 ??C) (Oral)     Resp  18     Ht  5\' 6"  (1.676 m)     Wt  170 lb 3.2 oz (77.2 kg)     SpO2  99%        BMI  27.47 kg/m??           Assessment & Plan:   Diagnoses and all orders for this visit:      1. Urethritis   Cover empirically for NG/GC   -     azithromycin (ZITHROMAX) 500 mg tab; Take 2 Tabs by mouth now for 1 dose.   -     cefTRIAXone (ROCEPHIN) 250 mg injection; 250 mg by IntraMUSCular route once for 1 dose.   -     CEFTRIAXONE SODIUM INJECTION PER 250 MG   -     PR THER/PROPH/DIAG INJECTION, SUBCUT/IM      2. Mass of left testicle   US to evaluate further. Referral to urology for continued symptoms.    -     US  SCROTUM/TESTICLES; Future      3. Screening examination for STD (sexually transmitted disease)   -     HEPATITIS PANEL, ACUTE   -     CT+NG+M GENITALIUM BY NAA, UR   -     HSV 1/2 AB, IGM   -     T PALLIDUM SCREEN W/REFLEX         I have discussed the diagnosis with the patient and the intended plan as seen in the above orders.  The patient has received an after-visit summary along with patient information handout.  I have discussed medication side effects and warnings with the  patient as well.           Follow-up and Dispositions      ??  Return if symptoms worsen or fail to improve.                   Joretta Bachelor, NP

## 2018-04-11 NOTE — Progress Notes (Signed)
Patient's STD testing was positive for Syphillis. Antibiotic was sent to pharmacy. His sexual partner also needs to be tested. He should RTC in 6 months for repeat testing.    Other STD testing was negative for HIV, gonorrhea, chlamydia and hepatitis.

## 2018-04-11 NOTE — Patient Instructions (Signed)
Learning How to Use a Male Condom  What is a male condom?    Condoms can be used to prevent pregnancy. They can also help protect against sexually transmitted infections (STIs). You must use a new condom every time you have sex.  Condoms prevent pregnancy by keeping sperm and eggs apart. The condom holds the sperm so the sperm can't get into the vagina.  A male condom is a tube of soft rubber or plastic with a closed end. It fits over the penis.  There are many kinds of male condoms. Some condoms are lubricated. Some are ribbed. Most have a "reservoir tip" for holding the semen. You can also buy condoms of different sizes.  How do you use a condom?  Condoms work best if you follow these steps.  ?? Use a new condom each time you have sex.  ?? Check the condom's expiration date. Do not use it past that date.  ?? When opening the condom wrapper, be sure not to poke a hole in the condom with your fingernails, teeth, or other sharp objects.  ?? Put the condom on as soon as the penis is hard (erect) and before any sexual contact with your partner.  ? First, hold the tip of the condom and squeeze out the air. This leaves room for the semen after you ejaculate.  ? If you are not circumcised, pull down the loose skin from the head of the penis (foreskin) before you put on the condom.  ? Hold on to the tip of the condom as you unroll the condom. Unroll it all the way down to the base of the penis.  ?? After you ejaculate, hold on to the condom at the base of the penis, and withdraw from your partner while your penis is still erect. This will keep semen from spilling out of the condom.  ?? Wash your hands after you handle a used condom.  How do you buy and store condoms?  ?? Male condoms may be available for free at family planning clinics. You can buy them without a prescription at drugstores, online, and in some grocery stores.  ?? Keep condoms wrapped in their original packages until you are ready to  use them. Store them in a cool, dry place out of direct sunlight.  ?? Don't keep rubber (latex) condoms in a glove compartment or other hot places for a long time. Heat weakens latex and increases the chance that the condom will break.  ?? Don't use condoms in damaged packages. And don't use condoms that are brittle, sticky, or discolored, even if they are not past their expiration date.  What else do you need to know?  ?? To protect yourself and your partner from STIs, use a condom during vaginal, oral, or anal sex.  ?? If the condom breaks or you think sperm may have leaked out into the vagina, the woman can use emergency contraception to help prevent pregnancy. The most effective emergency contraception is prescribed by a doctor. This includes the copper IUD (inserted by a doctor) or a prescription pill. You can also get emergency contraceptive pills without a prescription at most drugstores.  ?? Use a male condom with another form of birth control. It's the best way to prevent pregnancy.  ? You can use the condom with hormonal contraception, an intrauterine device (IUD), a diaphragm, a sponge, a shield, or a cervical cap.  ? Don't use a male condom with a male condom.  ? Use   spermicide as its instructions say. Don't put spermicide inside a condom.  ?? If you or your partner gets a rash or feels itchy after using a latex condom, talk to your doctor. You may have a latex allergy.  Where can you learn more?  Go to http://www.healthwise.net/GoodHelp[employer]ctions.  Enter R522 in the search box to learn more about "Learning How to Use a Male Condom."  Current as of: May 09, 2017  Content Version: 12.1  ?? 2006-2019 [employer]wise[employer]. Care instructions adapted under license by [employer] (which disclaims liability or warranty for this information). If you have questions about a medical condition or this instruction, always ask your healthcare professional. Healthwise,  Incorporated disclaims any warranty or liability for your use of this information.

## 2018-04-11 NOTE — Progress Notes (Signed)
Notified patient via my chart see my chart message from patient

## 2018-04-11 NOTE — Progress Notes (Signed)
Chief Complaint   Patient presents with   ??? Exposure to STD     penile discharge since this am , little burning sensation       Reviewed Record in preparation for visit and have obtained necessary documentation.     Identified pt with two pt identifiers (Name @ DOB)    Health Maintenance Due   Topic   ??? DTaP/Tdap/Td series (1 - Tdap)   ??? Influenza Age 39 to Adult          1. Have you been to the ER, urgent care clinic since your last visit?  Hospitalized since your last visit? No      2. Have you seen or consulted any other health care providers outside of the [facility] since your last visit?  Include any pap smears or colon screening. no

## 2018-04-11 NOTE — Progress Notes (Signed)
[facility]  Clinic Note    Levi KailVictor [name] is a 37 y.o. male who was seen in clinic today (04/11/2018).        Subjective:  STD Screening  Patient requests STD screening. Current symptoms are: dysuria and penile discharge. Onset of symptoms was abrupt and 2 days ago, and has been gradually worsening since that time. Sexual history reviewed with the patient. STD exposure: sexual contact with individual with uncertain background 1 week ago. Previous STD history: GC, chlamydia, HIV. 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 Contraception: none    Skin Mass   Patient complains of a left testicular mass. The patient reports that the mass has been present for 2 week. The onset of the mass was sudden.   Associated symptoms include enlargement.   Patient denies pain, redness or discoloration of the skin.    Prior treatments, if any: none.    Prior to Admission medications    Medication Sig Start Date End Date Taking? Authorizing Provider   olopatadine (PATANOL) 0.1 % ophthalmic solution Administer 2 Drops to both eyes two (2) times a day. 04/11/18  Yes Abcde Oneil, Tollie Pizzaimothy J, NP   propranolol (INDERAL) 60 mg tablet TAKE 1 TABLET BY MOUTH TWICE A DAY 04/05/18  Yes Sanderford, Soeria, NP   fluticasone propionate (FLONASE) 50 mcg/actuation nasal spray TAKE 2 SPRAY IN EACH NOSTRIL TWICE A DAY FOR 3 DAYS, THEN EVERY DAY FOR 11 DAYS 02/11/18  Yes Masterson, Dennison NancyAivi N, MD   loratadine (CLARITIN) 10 mg tablet TAKE 1 TABLET BY MOUTH EVERY DAY AS NEEDED FOR ALLERGIES 02/08/18  Yes Sanderford, Soeria, NP   hydrocortisone (ANUSOL-HC) 25 mg supp Insert 1 Suppository into rectum every twelve (12) hours. Indications: Hemorrhoids 03/23/17  Yes Vincente Poliobinson, Takisha, MD   TRIUMEQ tablet Take 1 Tab by mouth daily. 12/06/15  Yes Provider, Historical   ergocalciferol (ERGOCALCIFEROL) 50,000 unit capsule Take 50,000 Units by mouth every thirty (30) days. 03/22/15  Yes Provider, Historical    ketoconazole (NIZORAL) 2 % topical cream Apply  to affected area. 11/29/15   Provider, Historical          No Known Allergies        ROS  See HPI    Objective:   Physical Exam   Constitutional: He is oriented to person, place, and time. He appears well-developed and well-nourished.   Cardiovascular: Normal rate, regular rhythm and intact distal pulses. Exam reveals no gallop and no friction rub.   No murmur heard.  Pulmonary/Chest: Effort normal and breath sounds normal. No respiratory distress.   Genitourinary: Left testis shows swelling (no discrete mass noted, mild swelling of testicle noted).   Neurological: He is alert and oriented to person, place, and time.   Psychiatric: He has a normal mood and affect. His speech is normal and behavior is normal. Thought content normal.   Nursing note and vitals reviewed.        Visit Vitals  BP (!) 129/92 (BP 1 Location: Left arm, BP Patient Position: Sitting)   Pulse 66   Temp 98 ??F (36.7 ??C) (Oral)   Resp 18   Ht 5\' 6"  (1.676 m)   Wt 170 lb 3.2 oz (77.2 kg)   SpO2 99%   BMI 27.47 kg/m??       Assessment & Plan:  Diagnoses and all orders for this visit:    1. Urethritis  Cover empirically for NG/GC  -     azithromycin (ZITHROMAX) 500 mg tab; Take 2  Tabs by mouth now for 1 dose.  -     cefTRIAXone (ROCEPHIN) 250 mg injection; 250 mg by IntraMUSCular route once for 1 dose.  -     CEFTRIAXONE SODIUM INJECTION PER 250 MG  -     PR THER/PROPH/DIAG INJECTION, SUBCUT/IM    2. Mass of left testicle  US to evaluate further. Referral to urology for continued symptoms.   -     US SCROTUM/TESTICLES; Future    3. Screening examination for STD (sexually transmitted disease)  -     HEPATITIS PANEL, ACUTE  -     CT+NG+M GENITALIUM BY NAA, UR  -     HSV 1/2 AB, IGM  -     T PALLIDUM SCREEN W/REFLEX      I have discussed the diagnosis with the patient and the intended plan as seen in the above orders.  The patient has received an after-visit summary  along with patient information handout.  I have discussed medication side effects and warnings with the patient as well.      Follow-up and Dispositions    ?? Return if symptoms worsen or fail to improve.           Joretta Bachelor, NP

## 2018-04-11 NOTE — Progress Notes (Signed)
Patient's STD testing was positive for Syphillis. Antibiotic was sent to pharmacy. His sexual partner also needs to be tested. He should RTC in 6 months for repeat testing.    Other STD testing was negative for HIV, gonorrhea, chlamydia and hepatitis.

## 2018-04-11 NOTE — Progress Notes (Signed)
 Chief Complaint   Patient presents with   . Exposure to STD     penile discharge since this am , little burning sensation       Reviewed Record in preparation for visit and have obtained necessary documentation.     Identified pt with two pt identifiers (Name @ DOB)    Health Maintenance Due   Topic   . DTaP/Tdap/Td series (1 - Tdap)   . Influenza Age 39 to Adult          1. Have you been to the ER, urgent care clinic since your last visit?  Hospitalized since your last visit? No      2. Have you seen or consulted any other health care providers outside of the [facility] since your last visit?  Include any pap smears or colon screening. no

## 2018-04-16 LAB — HEPATITIS PANEL, ACUTE
HCV Ab: <0.1 s/co ratio (ref 0.0–0.9)
Hep A IgM: NEGATIVE
Hep B Core Ab, IgM: NEGATIVE
Hep B Core Ab, IgM: NEGATIVE
Hep B surface Ag screen: NEGATIVE
Hep C Virus Ab: <0.1 s/co ratio (ref 0.0–0.9)
Hepatitis A Ab, IgM: NEGATIVE
Hepatitis B Surface Ag: NEGATIVE

## 2018-04-16 LAB — T PALLIDUM SCREEN W/REFLEX
T PALLIDUM AB: POSITIVE — AB
T PALLIDUM AB: POSITIVE — AB

## 2018-04-16 LAB — HERPES SIMPLEX VIRUS I/II IGM: HSV I/II AB, IGM: <0.91 Ratio (ref 0.00–0.90)

## 2018-04-16 LAB — CT+NG+M GENITALIUM BY NAA, UR
C. trachomatis by NAA: NEGATIVE
M. genitalium by NAA: NEGATIVE
N. gonorrhoeae by NAA: NEGATIVE

## 2018-04-16 LAB — T PALLIDUM IMMUNOBLOT: T. pallidum by Immunoblot: POSITIVE — AB

## 2018-04-16 LAB — HSV 1/2 AB, IGM: HSV I/II Ab, IgM: <0.91 Ratio (ref 0.00–0.90)

## 2018-04-16 LAB — RPR: RPR: NONREACTIVE

## 2018-04-17 ENCOUNTER — Encounter

## 2018-04-18 MED ORDER — DOXYCYCLINE HYCLATE 100 MG CAP
100 mg | ORAL_CAPSULE | Freq: Two times a day (BID) | ORAL | 0 refills | Status: AC
Start: 2018-04-18 — End: 2018-04-24

## 2018-04-18 NOTE — Telephone Encounter (Signed)
[facility] [address]     [phone] [facility] called in prescription for patient doxycycline via VM     [phone] [facility] to [facility] antibiotic [facility]

## 2018-04-18 NOTE — Telephone Encounter (Signed)
From: Donovan KailVictor Sturgill  To: Joretta Bacheloralbert, Timothy J, NP  Sent: 04/17/2018 9:45 PM EDT  Subject: Test Results Question    I have a question about T PALLIDUM IMMUNOBLOT resulted on 04/16/18, 10:43 AM.    I'm confused is I negative or I'm positive for syphilis?

## 2018-04-19 NOTE — Telephone Encounter (Signed)
Pt. is calling requesting a call back in regards to some questions he had about his las visit when he came and saw NP.Talbert.     Best call #[phone]

## 2018-04-19 NOTE — Telephone Encounter (Signed)
[phone] spoke to patient and had a question in regards to his syphilis wanted to ask about his syphilis diagnosis I advised needs to be treated with antibiotic and also advised his partner needs to be treated as well and patient understand

## 2018-05-03 ENCOUNTER — Encounter: Attending: Family | Primary: Family

## 2018-05-03 ENCOUNTER — Encounter

## 2018-05-03 MED ORDER — PROPRANOLOL 60 MG TAB
60 mg | ORAL_TABLET | ORAL | 0 refills | Status: DC
Start: 2018-05-03 — End: 2018-06-28

## 2018-05-17 ENCOUNTER — Inpatient Hospital Stay: Admit: 2018-05-17 | Payer: MEDICAID | Primary: Family

## 2018-05-17 DIAGNOSIS — N433 Hydrocele, unspecified: Secondary | ICD-10-CM

## 2018-05-17 NOTE — Progress Notes (Signed)
Patient viewed results via my chart

## 2018-05-17 NOTE — Progress Notes (Signed)
US showed normal left testicle.  There is a tiny right hydrocele (accumulation of fluids around a testicle) which does not require any treatment and should not cause any problems. Referral to urology for continued symptoms.

## 2018-05-17 NOTE — Progress Notes (Signed)
US showed normal left testicle.  There is a tiny right hydrocele (accumulation of fluids around a testicle) which does not require any treatment and should not cause any problems. Referral to urology for continued symptoms.

## 2018-06-28 ENCOUNTER — Encounter

## 2018-06-28 MED ORDER — PROPRANOLOL 60 MG TAB
60 mg | ORAL_TABLET | ORAL | 0 refills | Status: DC
Start: 2018-06-28 — End: 2018-08-08

## 2018-08-08 ENCOUNTER — Encounter

## 2018-08-08 MED ORDER — OLOPATADINE 0.1 % EYE DROPS
0.1 % | OPHTHALMIC | 2 refills | Status: DC
Start: 2018-08-08 — End: 2020-12-29

## 2018-08-09 MED ORDER — PROPRANOLOL 60 MG TAB
60 mg | ORAL_TABLET | ORAL | 0 refills | Status: DC
Start: 2018-08-09 — End: 2018-09-06

## 2018-08-09 MED ORDER — LORATADINE 10 MG TAB
10 mg | ORAL_TABLET | ORAL | 5 refills | Status: DC
Start: 2018-08-09 — End: 2019-01-24

## 2018-09-05 ENCOUNTER — Encounter

## 2018-09-06 MED ORDER — PROPRANOLOL 60 MG TAB
60 mg | ORAL_TABLET | ORAL | 0 refills | Status: DC
Start: 2018-09-06 — End: 2018-10-04

## 2018-09-06 NOTE — Telephone Encounter (Signed)
Needs office visit and blood work

## 2018-09-27 LAB — AMB EXT HGBA1C
Hemoglobin A1C, External: 4.3 NA
Hemoglobin A1c, External: 4.3

## 2018-09-27 LAB — AMB EXT CREATININE
Creatinine, External: 1.32
Creatinine, External: 1.32 NA

## 2018-10-03 ENCOUNTER — Encounter

## 2018-10-04 MED ORDER — PROPRANOLOL 60 MG TAB
60 mg | ORAL_TABLET | ORAL | 0 refills | Status: DC
Start: 2018-10-04 — End: 2018-11-01

## 2018-10-04 NOTE — Telephone Encounter (Signed)
Needs fasting office visit

## 2018-11-01 ENCOUNTER — Encounter

## 2018-11-01 MED ORDER — PROPRANOLOL 60 MG TAB
60 mg | ORAL_TABLET | ORAL | 0 refills | Status: DC
Start: 2018-11-01 — End: 2018-11-27

## 2018-11-27 ENCOUNTER — Encounter

## 2018-11-27 MED ORDER — PROPRANOLOL 60 MG TAB
60 mg | ORAL_TABLET | ORAL | 0 refills | Status: DC
Start: 2018-11-27 — End: 2018-12-27

## 2018-12-27 ENCOUNTER — Encounter

## 2018-12-27 MED ORDER — PROPRANOLOL 60 MG TAB
60 mg | ORAL_TABLET | ORAL | 0 refills | Status: DC
Start: 2018-12-27 — End: 2019-01-24

## 2019-01-09 ENCOUNTER — Encounter

## 2019-01-13 NOTE — Telephone Encounter (Signed)
[phone] attempted to call patient no answer notified needs VV for medication refill via VM

## 2019-01-24 ENCOUNTER — Encounter

## 2019-01-24 MED ORDER — LORATADINE 10 MG TAB
10 mg | ORAL_TABLET | ORAL | 5 refills | Status: DC
Start: 2019-01-24 — End: 2019-07-30

## 2019-01-24 MED ORDER — PROPRANOLOL 60 MG TAB
60 mg | ORAL_TABLET | ORAL | 0 refills | Status: DC
Start: 2019-01-24 — End: 2019-03-01

## 2019-02-18 ENCOUNTER — Encounter

## 2019-02-18 NOTE — Telephone Encounter (Signed)
Last Visit: 04/11/18  NP Linton Rump  Next Appointment: Not scheduled  Previous Refill Encounter(s): 01/24/19  #[phone]    Requested Prescriptions     Pending Prescriptions Disp Refills   ??? propranoloL (INDERAL) 60 mg tablet 60 Tab 0     Sig: Take 1 Tab by mouth two (2) times a day.

## 2019-02-20 NOTE — Telephone Encounter (Signed)
[phone] notified patient via VM needs virtual visit for medication refill

## 2019-02-24 ENCOUNTER — Encounter

## 2019-03-01 MED ORDER — PROPRANOLOL 60 MG TAB
60 mg | ORAL_TABLET | ORAL | 0 refills | Status: DC
Start: 2019-03-01 — End: 2019-04-04

## 2019-04-03 ENCOUNTER — Encounter

## 2019-04-04 MED ORDER — PROPRANOLOL 60 MG TAB
60 mg | ORAL_TABLET | ORAL | 0 refills | Status: DC
Start: 2019-04-04 — End: 2019-05-02

## 2019-04-04 NOTE — Telephone Encounter (Signed)
Needs fasting office visit and blood work

## 2019-05-01 ENCOUNTER — Encounter

## 2019-05-02 MED ORDER — PROPRANOLOL 60 MG TAB
60 mg | ORAL_TABLET | ORAL | 0 refills | Status: DC
Start: 2019-05-02 — End: 2019-06-06

## 2019-05-02 NOTE — Telephone Encounter (Signed)
Needs office visit

## 2019-05-10 NOTE — Telephone Encounter (Signed)
Patient sent mychart notification of available openings with Sanderford, NP via mychart.  Angelyn Punt Sherie Dobrowolski, LPN    Sanderford, NP has several openings for Tuesday, May 14, 2019 at 8:00 am, 8:30 am, 8:45 am, 9:45 am.  I did review where you are requesting blood work at time of your visit , currently we are not completing blood draws in our office, we are referring patients to area [facility] facilities to complete labs.  Please let me know if any of these times work for you.

## 2019-05-10 NOTE — Telephone Encounter (Signed)
-----   Message from Frances Furbish sent at 05/07/2019 11:23 AM EDT -----  Regarding: Np Sanderford/telephone  Contact: [phone]  Caller's first and last name and relationship to patient (if not the patient): pt  Best contact number: [phone]  Preferred date and time: first avail in practice.  Scheduled appointment date and time:  Reason for appointment: Refill on eye drops and stronger allergy meds.  Details to clarify the request: Pt would also like lab work at appt.

## 2019-05-16 NOTE — Telephone Encounter (Signed)
Patient read mychart message on 05/10/2019 of appointment availability for 05/14/2019 no response to schedule appointment.  Levi Mitts, LPN

## 2019-06-06 ENCOUNTER — Encounter

## 2019-06-06 MED ORDER — PROPRANOLOL 60 MG TAB
60 mg | ORAL_TABLET | ORAL | 0 refills | Status: DC
Start: 2019-06-06 — End: 2019-07-09

## 2019-06-06 NOTE — Telephone Encounter (Signed)
Chief Complaint   Patient presents with   ??? Appointment     Patient was left an voice message to contact our office to schedule an appointment for continued care and medication refills.  Patient was sent mychart message informing of appointment and patient has read notification.

## 2019-06-06 NOTE — Telephone Encounter (Signed)
Need office visit

## 2019-07-09 ENCOUNTER — Encounter

## 2019-07-09 MED ORDER — PROPRANOLOL 60 MG TAB
60 mg | ORAL_TABLET | ORAL | 0 refills | Status: DC
Start: 2019-07-09 — End: 2019-07-30

## 2019-07-09 NOTE — Telephone Encounter (Signed)
Only for two weeks, needs office visit and blood work, last seen in august 2019

## 2019-07-30 ENCOUNTER — Encounter

## 2019-07-30 MED ORDER — PROPRANOLOL 60 MG TAB
60 mg | ORAL_TABLET | ORAL | 0 refills | Status: DC
Start: 2019-07-30 — End: 2019-08-27

## 2019-07-30 MED ORDER — LORATADINE 10 MG TAB
10 mg | ORAL_TABLET | ORAL | 5 refills | Status: DC
Start: 2019-07-30 — End: 2020-01-14

## 2019-08-26 ENCOUNTER — Encounter

## 2019-08-27 MED ORDER — PROPRANOLOL 60 MG TAB
60 mg | ORAL_TABLET | ORAL | 0 refills | Status: DC
Start: 2019-08-27 — End: 2019-09-30

## 2019-08-27 NOTE — Telephone Encounter (Signed)
He must have office visit, ,last seen summer of 2019  I will not fill it next time

## 2019-09-03 NOTE — Telephone Encounter (Signed)
contacted patient to schedule office visit for patient.Name and DOB verified, patient sated he will call and schedule himself when he is able to get a ride.

## 2019-09-29 ENCOUNTER — Encounter

## 2019-09-30 MED ORDER — PROPRANOLOL 60 MG TAB
60 mg | ORAL_TABLET | ORAL | 0 refills | Status: DC
Start: 2019-09-30 — End: 2019-10-24

## 2019-09-30 NOTE — Telephone Encounter (Signed)
Needs fasting office visit and blood work, seen a year ago

## 2019-10-23 ENCOUNTER — Encounter

## 2019-10-24 MED ORDER — PROPRANOLOL 60 MG TAB
60 mg | ORAL_TABLET | ORAL | 0 refills | Status: DC
Start: 2019-10-24 — End: 2019-11-21

## 2019-11-21 ENCOUNTER — Encounter

## 2019-11-21 MED ORDER — PROPRANOLOL 60 MG TAB
60 mg | ORAL_TABLET | ORAL | 0 refills | Status: DC
Start: 2019-11-21 — End: 2019-12-17

## 2019-12-08 ENCOUNTER — Encounter

## 2019-12-09 NOTE — Telephone Encounter (Signed)
Needs office visit and blood work, last seen 2019

## 2019-12-09 NOTE — Telephone Encounter (Signed)
NP Sanderford,    Received call from [facility] asking for clarification of dose of propranolol as stating rx states to take twice daily and we are only sending 15 day supply.     Noted refusal of refill 12/08/19 as patient is past due for appt and labs.  Thanks, Wilda Wetherell

## 2019-12-09 NOTE — Telephone Encounter (Signed)
Chief Complaint   Patient presents with   ??? Appointment     Patient was left an voice message requesting to contact our office to schedule an appointment for continued care and medication refills.

## 2019-12-17 ENCOUNTER — Encounter

## 2019-12-17 MED ORDER — PROPRANOLOL 60 MG TAB
60 mg | ORAL_TABLET | Freq: Two times a day (BID) | ORAL | 0 refills | Status: DC
Start: 2019-12-17 — End: 2020-01-01

## 2019-12-17 NOTE — Telephone Encounter (Signed)
Case manager continues to call with request for refill of propranolol.  I contacted them to advise patient needs to schedule office visit.     [facility] pharmacy: [phone] Thanks, Camelia Eng

## 2019-12-17 NOTE — Telephone Encounter (Signed)
OUTBOUND CALL to pt LVM advising pt to call back and schedule an appt because he is past due for a follow up.

## 2020-01-01 ENCOUNTER — Encounter

## 2020-01-01 MED ORDER — PROPRANOLOL 60 MG TAB
60 mg | ORAL_TABLET | ORAL | 0 refills | Status: DC
Start: 2020-01-01 — End: 2020-06-11

## 2020-01-14 ENCOUNTER — Encounter

## 2020-01-14 MED ORDER — LORATADINE 10 MG TAB
10 mg | ORAL_TABLET | ORAL | 5 refills | Status: DC
Start: 2020-01-14 — End: 2020-08-13

## 2020-05-19 ENCOUNTER — Encounter

## 2020-05-19 NOTE — Telephone Encounter (Signed)
Patient moved to [place]  Stop sending his refill

## 2020-06-11 ENCOUNTER — Encounter

## 2020-06-11 MED ORDER — PROPRANOLOL 60 MG TAB
60 mg | ORAL_TABLET | ORAL | 0 refills | Status: DC
Start: 2020-06-11 — End: 2020-12-29

## 2020-07-16 ENCOUNTER — Encounter

## 2020-07-16 NOTE — Telephone Encounter (Signed)
Needs office visit

## 2020-07-30 ENCOUNTER — Encounter

## 2020-08-01 NOTE — Telephone Encounter (Signed)
Needs office visit and blood work

## 2020-08-13 ENCOUNTER — Encounter

## 2020-08-13 MED ORDER — LORATADINE 10 MG TAB
10 mg | ORAL_TABLET | ORAL | 5 refills | Status: DC
Start: 2020-08-13 — End: 2020-12-29

## 2020-12-27 ENCOUNTER — Encounter: Primary: Family

## 2020-12-29 ENCOUNTER — Ambulatory Visit: Admit: 2020-12-29 | Payer: MEDICAID | Attending: Family | Primary: Family

## 2020-12-29 ENCOUNTER — Ambulatory Visit: Attending: Family | Primary: Family

## 2020-12-29 DIAGNOSIS — Z0000 Encounter for general adult medical examination without abnormal findings: Secondary | ICD-10-CM

## 2020-12-29 MED ORDER — FEXOFENADINE 180 MG TAB
180 mg | ORAL_TABLET | Freq: Every day | ORAL | 2 refills | Status: AC
Start: 2020-12-29 — End: ?

## 2020-12-29 MED ORDER — BUTALBITAL-ACETAMINOPHEN-CAFFEINE 50 MG-325 MG-40 MG TAB
50-325-40 mg | ORAL_TABLET | Freq: Four times a day (QID) | ORAL | 0 refills | Status: AC | PRN
Start: 2020-12-29 — End: ?

## 2020-12-29 MED ORDER — DULOXETINE 30 MG CAP, DELAYED RELEASE
30 mg | ORAL_CAPSULE | Freq: Every day | ORAL | 0 refills | Status: DC
Start: 2020-12-29 — End: 2021-01-18

## 2020-12-29 MED ORDER — PROPRANOLOL 60 MG TAB
60 mg | ORAL_TABLET | Freq: Two times a day (BID) | ORAL | 0 refills | Status: DC
Start: 2020-12-29 — End: 2021-03-09

## 2020-12-29 NOTE — Progress Notes (Signed)
Chief Complaint   Patient presents with   . Complete Physical   . Anxiety     experienced anxiousness , fatigue, difficulty swallowing    . Other     [facility] ( was informed that there is an concern  about kidney function )    . Penile Discharge     symptoms occuring for one month      1. Have you been to the ER, urgent care clinic since your last visit?  Hospitalized since your last visit?No    2. Have you seen or consulted any other health care providers outside of the [facility] since your last visit?  Include any pap smears or colon screening. No

## 2020-12-29 NOTE — Progress Notes (Signed)
Subjective:     Levi [name] is a 40 y.o. male presenting for annual exam and complete physical.    Patient Active Problem List   Diagnosis Code   ??? Hemorrhoids K64.9   ??? Headache(784.0) R51   ??? Chronic allergic rhinitis J30.9   ??? Microscopic hematuria R31.29   ??? HIV positive (HCC) Z21   ??? Acne vulgaris L70.0   ??? HTN (hypertension), benign I10     Patient Active Problem List    Diagnosis Date Noted   ??? HTN (hypertension), benign 12/20/2015   ??? HIV positive (HCC) 07/24/2014   ??? Acne vulgaris 07/24/2014   ??? Microscopic hematuria 03/02/2014   ??? Hemorrhoids    ??? Headache(784.0)    ??? Chronic allergic rhinitis      Current Outpatient Medications   Medication Sig Dispense Refill   ??? bictegrav-emtricit-tenofov ala (Biktarvy) tab tablet Take 1 Tablet by mouth daily.     ??? olopatadine (PATADAY) 0.2 % drop ophthalmic solution      ??? azelastine (ASTEPRO) 205.5 mcg (0.15 %) 1 Spray by Both Nostrils route daily.     ??? fexofenadine (ALLEGRA) 180 mg tablet Take 1 Tablet by mouth daily. 30 Tablet 2   ??? propranoloL (INDERAL) 60 mg tablet Take 1 Tablet by mouth two (2) times a day. 180 Tablet 0   ??? butalbital-acetaminophen-caffeine (FIORICET, ESGIC) 50-325-40 mg per tablet Take 1 Tablet by mouth every six (6) hours as needed for Headache. 30 Tablet 0   ??? DULoxetine (CYMBALTA) 30 mg capsule Take 1 Capsule by mouth daily. 30 Capsule 0   ??? lisinopriL (PRINIVIL, ZESTRIL) 5 mg tablet 5 mg. (Patient not taking: Reported on 12/29/2020)       Allergies   Allergen Reactions   ??? Emtricitab-Rilpivir-Tenofo Ala Other (comments)     Reaction Type: Allergy; Severity: Mild; Reaction(s): Urticaria. Rash     Past Medical History:   Diagnosis Date   ??? Asthma     denies asthma   ??? Chronic allergic rhinitis    ??? Contact dermatitis and other eczema, due to unspecified cause    ??? GERD (gastroesophageal reflux disease)    ??? Headache(784.0) 2010   ??? Hemorrhoids 07-2010   ??? HIV infection (HCC)    ??? Hypertension      History reviewed. No pertinent surgical  history.  Family History   Problem Relation Age of Onset   ??? Headache Mother    ??? Hypertension Mother    ??? Breast Cancer Mother         survivor   ??? Migraines Mother    ??? Heart Disease Father    ??? Hypertension Father    ??? Heart Attack Father    ??? Heart Disease Paternal Grandfather      Social History     Tobacco Use   ??? Smoking status: Never Smoker   ??? Smokeless tobacco: Never Used   Substance Use Topics   ??? Alcohol use: Yes     Comment: occasionally      1)Patient is HIV, positive and is followed at [facility], will make appointment for the next weeks  2) He just mover back from [place], C/O depression, anxiety  3) requesting STD check  4) refills on medications.  Review of Systems  A comprehensive review of systems was negative except for that written in the HPI.    Objective:     Visit Vitals  BP 120/72 (BP 1 Location: Left arm, BP Patient Position: Sitting, BP Cuff Size:  Adult)   Pulse 72   Temp 98.6 ??F (37 ??C) (Temporal)   Resp 16   Ht 5\' 6"  (1.676 m)   Wt 164 lb (74.4 kg)   BMI 26.47 kg/m??     Physical exam:   General appearance - alert, well appearing, and in no distress  Mental status - alert, oriented to person, place, and time  Eyes - pupils equal and reactive, extraocular eye movements intact  Ears - bilateral TM's and external ear canals normal  Nose - normal and patent, no erythema, discharge or polyps  Mouth - mucous membranes moist, pharynx normal without lesions  Neck - supple, no significant adenopathy  Lymphatics - no palpable lymphadenopathy, no hepatosplenomegaly  Chest - clear to auscultation, no wheezes, rales or rhonchi, symmetric air entry  Heart - normal rate, regular rhythm, normal S1, S2, no murmurs, rubs, clicks or gallops  Abdomen - soft, nontender, nondistended, no masses or organomegaly  Back exam - full range of motion, no tenderness, palpable spasm or pain on motion  Neurological - alert, oriented, normal speech, no focal findings or movement disorder noted  Musculoskeletal - no joint  tenderness, deformity or swelling  Extremities - peripheral pulses normal, no pedal edema, no clubbing or cyanosis  Skin - normal coloration and turgor, no rashes, no suspicious skin lesions noted     Assessment/Plan:     Diagnoses and all orders for this visit:    1. General medical examination  -     TSH 3RD GENERATION; Future        -     T4, FREE; Future  2. Vitamin D deficiency  -     VITAMIN D, 25 HYDROXY; Future    3. HTN (hypertension), benign  -     LIPID PANEL; Future  -     METABOLIC PANEL, COMPREHENSIVE; Future  -     CBC W/O DIFF; Future  -     propranoloL (INDERAL) 60 mg tablet; Take 1 Tablet by mouth two (2) times a day.    4. Atypical cluster headache  -     butalbital-acetaminophen-caffeine (FIORICET, ESGIC) 50-325-40 mg per tablet; Take 1 Tablet by mouth every six (6) hours as needed for Headache.    5. Environmental and seasonal allergies  -     fexofenadine (ALLEGRA) 180 mg tablet; Take 1 Tablet by mouth daily.    6. Exposure to sexually transmitted disease (STD)  -     CHLAMYDIA / GC-AMPLIFIED  -     RPR; Future  -     HEPATITIS PANEL, ACUTE; Future  -     HSV 1/2 AB, IGG/IGM; Future    7. Anxiety  -    Start DULoxetine (CYMBALTA) 30 mg capsule; Take 1 Capsule by mouth daily.    Await labs  Follow up in 3 weeks    .

## 2020-12-31 LAB — HEPATITIS PANEL, ACUTE
Hep A IgM: NONREACTIVE
Hep B Core Ab, IgM: NONREACTIVE
Hep B surface Ag Interp.: NEGATIVE
Hep C virus Ab Interp.: NONREACTIVE
Hepatitis A, IgM: NONREACTIVE
Hepatitis B Surface Ag: <0.1 Index
Hepatitis B core, IgM: NONREACTIVE
Hepatitis B surface Ag: <0.1 Index
Hepatitis C Ab: NONREACTIVE

## 2020-12-31 LAB — COMPREHENSIVE METABOLIC PANEL
ALT: 23 U/L (ref 12–78)
AST: 11 U/L — ABNORMAL LOW (ref 15–37)
Albumin/Globulin Ratio: 1.5 (ref 1.1–2.2)
Albumin: 4.4 g/dL (ref 3.5–5.0)
Alkaline Phosphatase: 54 U/L (ref 45–117)
Anion Gap: 5 mmol/L (ref 5–15)
BUN: 11 MG/DL (ref 6–20)
Bun/Cre Ratio: 9 — ABNORMAL LOW (ref 12–20)
CO2: 28 mmol/L (ref 21–32)
Calcium: 9.5 MG/DL (ref 8.5–10.1)
Chloride: 109 mmol/L — ABNORMAL HIGH (ref 97–108)
Creatinine: 1.16 MG/DL (ref 0.70–1.30)
EGFR IF NonAfrican American: >60 mL/min/{1.73_m2} (ref >60)
GFR African American: >60 mL/min/{1.73_m2} (ref >60)
Globulin: 2.9 g/dL (ref 2.0–4.0)
Glucose: 66 mg/dL (ref 65–100)
Potassium: 4.4 mmol/L (ref 3.5–5.1)
Sodium: 142 mmol/L (ref 136–145)
Total Bilirubin: 0.3 MG/DL (ref 0.2–1.0)
Total Protein: 7.3 g/dL (ref 6.4–8.2)

## 2020-12-31 LAB — CBC
Hematocrit: 41.1 % (ref 36.6–50.3)
Hemoglobin: 12.7 g/dL (ref 12.1–17.0)
MCH: 30.9 PG (ref 26.0–34.0)
MCHC: 30.9 g/dL (ref 30.0–36.5)
MCV: 100 FL — ABNORMAL HIGH (ref 80.0–99.0)
MPV: 11.5 FL (ref 8.9–12.9)
NRBC Absolute: 0 10*3/uL (ref 0.00–0.01)
Nucleated RBCs: 0 PER 100 WBC
Platelets: 272 10*3/uL (ref 150–400)
RBC: 4.11 M/uL (ref 4.10–5.70)
RDW: 13.3 % (ref 11.5–14.5)
WBC: 4.6 10*3/uL (ref 4.1–11.1)

## 2020-12-31 LAB — T4, FREE
T4 Free: 0.9 NG/DL (ref 0.8–1.5)
T4, Free: 0.9 NG/DL (ref 0.8–1.5)

## 2020-12-31 LAB — LIPID PANEL
CHOL/HDL Ratio: 5 (ref 0.0–5.0)
Chol/HDL Ratio: 5 (ref 0.0–5.0)
Cholesterol, Total: 232 MG/DL — ABNORMAL HIGH (ref <200)
Cholesterol, total: 232 MG/DL — ABNORMAL HIGH (ref <200)
HDL Cholesterol: 46 MG/DL
HDL: 46 MG/DL
LDL Calculated: 174.8 MG/DL — ABNORMAL HIGH (ref 0–100)
LDL, calculated: 174.8 MG/DL — ABNORMAL HIGH (ref 0–100)
Triglyceride: 56 MG/DL (ref <150)
Triglycerides: 56 MG/DL (ref <150)
VLDL Cholesterol Calculated: 11.2 MG/DL
VLDL, calculated: 11.2 MG/DL

## 2020-12-31 LAB — TSH 3RD GENERATION
TSH: 0.82 u[IU]/mL (ref 0.36–3.74)
TSH: 0.82 u[IU]/mL (ref 0.36–3.74)

## 2020-12-31 LAB — RPR
RPR: NONREACTIVE
RPR: NONREACTIVE

## 2020-12-31 LAB — VITAMIN D 25 HYDROXY: Vit D, 25-Hydroxy: 31.9 ng/mL (ref 30–100)

## 2020-12-31 LAB — METABOLIC PANEL, COMPREHENSIVE
A-G Ratio: 1.5 (ref 1.1–2.2)
ALT (SGPT): 23 U/L (ref 12–78)
AST (SGOT): 11 U/L — ABNORMAL LOW (ref 15–37)
Albumin: 4.4 g/dL (ref 3.5–5.0)
Alk. phosphatase: 54 U/L (ref 45–117)
Anion gap: 5 mmol/L (ref 5–15)
BUN/Creatinine ratio: 9 — ABNORMAL LOW (ref 12–20)
BUN: 11 MG/DL (ref 6–20)
Bilirubin, total: 0.3 MG/DL (ref 0.2–1.0)
CO2: 28 mmol/L (ref 21–32)
Calcium: 9.5 MG/DL (ref 8.5–10.1)
Chloride: 109 mmol/L — ABNORMAL HIGH (ref 97–108)
Creatinine: 1.16 MG/DL (ref 0.70–1.30)
GFR est AA: >60 mL/min/{1.73_m2} (ref >60)
GFR est non-AA: >60 mL/min/{1.73_m2} (ref >60)
Globulin: 2.9 g/dL (ref 2.0–4.0)
Glucose: 66 mg/dL (ref 65–100)
Potassium: 4.4 mmol/L (ref 3.5–5.1)
Protein, total: 7.3 g/dL (ref 6.4–8.2)
Sodium: 142 mmol/L (ref 136–145)

## 2020-12-31 LAB — VITAMIN D, 25 HYDROXY: Vitamin D 25-Hydroxy: 31.9 ng/mL (ref 30–100)

## 2020-12-31 LAB — CBC W/O DIFF
ABSOLUTE NRBC: 0 10*3/uL (ref 0.00–0.01)
HCT: 41.1 % (ref 36.6–50.3)
HGB: 12.7 g/dL (ref 12.1–17.0)
MCH: 30.9 PG (ref 26.0–34.0)
MCHC: 30.9 g/dL (ref 30.0–36.5)
MCV: 100 FL — ABNORMAL HIGH (ref 80.0–99.0)
MPV: 11.5 FL (ref 8.9–12.9)
NRBC: 0 PER 100 WBC
PLATELET: 272 10*3/uL (ref 150–400)
RBC: 4.11 M/uL (ref 4.10–5.70)
RDW: 13.3 % (ref 11.5–14.5)
WBC: 4.6 10*3/uL (ref 4.1–11.1)

## 2021-01-01 LAB — CHLAMYDIA / GC-AMPLIFIED
CHLAMYDIA TRACHOMATIS RNA, TMA: NOT DETECTED
CHLAMYDIA TRACHOMATIS RNA, TMA: NOT DETECTED
N.GONORRHOEAE RNA,TMA: NOT DETECTED
N.GONORRHOEAE RNA,TMA: NOT DETECTED

## 2021-01-02 ENCOUNTER — Encounter

## 2021-01-02 LAB — HSV 1/2 AB, IGG/IGM
HSV 1 Ab, IgG, type spec.: 1.86 index — ABNORMAL HIGH (ref 0.00–0.90)
HSV 1 IGG,TYPE SPEC, 164899: 1.86 index — ABNORMAL HIGH (ref 0.00–0.90)
HSV 2 Ab IgG, type spec.: 14.7 index — ABNORMAL HIGH (ref 0.00–0.90)
HSV 2 IGG, TYPE SPEC, 163153: 14.7 index — ABNORMAL HIGH (ref 0.00–0.90)
HSV I/II AB, IGM: <0.91 Ratio (ref 0.00–0.90)
HSV I/II Ab, IgM: <0.91 Ratio (ref 0.00–0.90)

## 2021-01-02 MED ORDER — ROSUVASTATIN 5 MG TAB
5 mg | ORAL_TABLET | Freq: Every evening | ORAL | 0 refills | Status: DC
Start: 2021-01-02 — End: 2021-03-09

## 2021-01-02 MED ORDER — VALACYCLOVIR 1 G TAB
1 gram | ORAL_TABLET | Freq: Three times a day (TID) | ORAL | 0 refills | Status: AC
Start: 2021-01-02 — End: ?

## 2021-01-18 ENCOUNTER — Ambulatory Visit: Admit: 2021-01-18 | Payer: MEDICAID | Attending: Family | Primary: Family

## 2021-01-18 ENCOUNTER — Ambulatory Visit: Attending: Family | Primary: Family

## 2021-01-18 DIAGNOSIS — F419 Anxiety disorder, unspecified: Secondary | ICD-10-CM

## 2021-01-18 MED ORDER — LISINOPRIL 5 MG TAB
5 mg | ORAL_TABLET | Freq: Two times a day (BID) | ORAL | 0 refills | Status: DC
Start: 2021-01-18 — End: 2021-04-22

## 2021-01-18 MED ORDER — HYDROXYZINE 10 MG TAB
10 mg | ORAL_TABLET | Freq: Three times a day (TID) | ORAL | 2 refills | Status: AC | PRN
Start: 2021-01-18 — End: 2021-01-28

## 2021-01-18 NOTE — Progress Notes (Signed)
Chief Complaint   Patient presents with   ??? Depression   ??? Anxiety     1. Have you been to the ER, urgent care clinic since your last visit?  Hospitalized since your last visit?No    2. Have you seen or consulted any other health care providers outside of the [facility] since your last visit?  Include any pap smears or colon screening. No

## 2021-01-18 NOTE — Progress Notes (Signed)
[facility]  Clinic Note  Subjective:      Levi [name] is a 40 y.o. male who presents for follow up on anxiety and depression. Started him on Cymbalta 30 mg , states that it is making him to have nausea and it is not helping with anxiety. He has tried Hydroxyzine in the past with good anxiety relief.   He doesn't want anything for depression, but is open for therapy.    Past Medical History:   Diagnosis Date   ??? Asthma     denies asthma   ??? Chronic allergic rhinitis    ??? Contact dermatitis and other eczema, due to unspecified cause    ??? GERD (gastroesophageal reflux disease)    ??? Headache(784.0) 2010   ??? Hemorrhoids 07-2010   ??? HIV infection (HCC)    ??? Hypertension      History reviewed. No pertinent surgical history.    Current Outpatient Medications   Medication Sig Dispense Refill   ??? hydrOXYzine HCL (ATARAX) 10 mg tablet Take 1 Tablet by mouth three (3) times daily as needed for Agitation for up to 10 days. 90 Tablet 2   ??? valACYclovir (VALTREX) 1 gram tablet Take 1 Tablet by mouth three (3) times daily. 21 Tablet 0   ??? rosuvastatin (CRESTOR) 5 mg tablet Take 1 Tablet by mouth nightly. 90 Tablet 0   ??? bictegrav-emtricit-tenofov ala (Biktarvy) tab tablet Take 1 Tablet by mouth daily.     ??? olopatadine (PATADAY) 0.2 % drop ophthalmic solution      ??? azelastine (ASTEPRO) 205.5 mcg (0.15 %) 1 Spray by Both Nostrils route daily.     ??? fexofenadine (ALLEGRA) 180 mg tablet Take 1 Tablet by mouth daily. 30 Tablet 2   ??? propranoloL (INDERAL) 60 mg tablet Take 1 Tablet by mouth two (2) times a day. 180 Tablet 0   ??? butalbital-acetaminophen-caffeine (FIORICET, ESGIC) 50-325-40 mg per tablet Take 1 Tablet by mouth every six (6) hours as needed for Headache. 30 Tablet 0   ??? ammonium lactate (AMLACTIN) 12 % topical cream APPLY TO THE AFFECTED AREA ON ARMS TWICE DAILY     ??? ketoconazole (NIZORAL) 2 % topical cream APPLY TO THE AFFECTED AREA ON THE CHEST TWICE DAILY       Allergies   Allergen  Reactions   ??? Emtricitab-Rilpivir-Tenofo Ala Other (comments)     Reaction Type: Allergy; Severity: Mild; Reaction(s): Urticaria. Rash       ROS:   Complete review of systems was reviewed with pertinent information listed in HPI.  Review of Systems   Constitutional: Negative.    HENT: Negative.    Respiratory: Negative.    Cardiovascular: Negative.    Gastrointestinal: Negative.    Genitourinary: Negative.    Musculoskeletal: Negative.    Psychiatric/Behavioral: Positive for depression. The patient is nervous/anxious.        Objective:     Visit Vitals  BP 138/88   Pulse 75   Temp 97.6 ??F (36.4 ??C) (Temporal)   Resp 16   Ht 5\' 6"  (1.676 m)   Wt 166 lb 9.6 oz (75.6 kg)   SpO2 100%   BMI 26.89 kg/m??       Vitals and Nurse Documentation reviewed.     Physical Exam  Constitutional:       Appearance: Normal appearance.   HENT:      Mouth/Throat:      Mouth: Mucous membranes are moist.   Cardiovascular:  Rate and Rhythm: Normal rate and regular rhythm.      Pulses: Normal pulses.      Heart sounds: Normal heart sounds.   Pulmonary:      Effort: Pulmonary effort is normal.      Breath sounds: Normal breath sounds.   Abdominal:      General: Bowel sounds are normal.      Palpations: Abdomen is soft.   Musculoskeletal:      Cervical back: Normal range of motion and neck supple.   Neurological:      Mental Status: He is alert.   Psychiatric:         Mood and Affect: Mood normal.         Thought Content: Thought content normal.         Assessment/Plan:     Diagnoses and all orders for this visit:    1. Anxiety  -     hydrOXYzine HCL (ATARAX) 10 mg tablet; Take 1 Tablet by mouth three (3) times daily as needed for Agitation for up to 10 days.    2. Other depression  -     REFERRAL TO therapist     3. HTN (hypertension), benign  -     lisinopriL (PRINIVIL, ZESTRIL) 5 mg tablet; Take 1 Tablet by mouth two (2) times a day.  Follow up in August, sooner if needed            Discussed expected course/resolution/complications of  diagnosis in detail with patient. ??  Medication risks/benefits/costs/interactions/alternatives discussed with patient. ??  Pt was given an after visit summary which includes diagnoses, current medications & vitals. ??Pt expressed understanding with the diagnosis and plan

## 2021-02-03 ENCOUNTER — Telehealth: Admit: 2021-02-03 | Discharge: 2021-02-03 | Payer: MEDICAID | Attending: Clinical | Primary: Family

## 2021-02-03 ENCOUNTER — Telehealth: Attending: Adult Health | Primary: Family

## 2021-02-03 DIAGNOSIS — F419 Anxiety disorder, unspecified: Secondary | ICD-10-CM

## 2021-02-03 DIAGNOSIS — F32A Depression, unspecified: Secondary | ICD-10-CM

## 2021-02-11 MED ORDER — LORATADINE 10 MG TAB
10 mg | ORAL_TABLET | ORAL | 5 refills | Status: AC
Start: 2021-02-11 — End: ?

## 2021-02-17 ENCOUNTER — Ambulatory Visit: Payer: MEDICAID | Attending: Clinical | Primary: Family

## 2021-03-08 ENCOUNTER — Telehealth: Admit: 2021-03-08 | Discharge: 2021-03-08 | Payer: MEDICAID | Attending: Clinical | Primary: Family

## 2021-03-08 ENCOUNTER — Telehealth: Attending: Adult Health | Primary: Family

## 2021-03-08 DIAGNOSIS — F419 Anxiety disorder, unspecified: Secondary | ICD-10-CM

## 2021-03-08 DIAGNOSIS — F32A Depression, unspecified: Secondary | ICD-10-CM

## 2021-03-09 ENCOUNTER — Encounter

## 2021-03-09 MED ORDER — PROPRANOLOL 60 MG TAB
60 mg | ORAL_TABLET | ORAL | 0 refills | Status: DC
Start: 2021-03-09 — End: 2021-06-20

## 2021-03-09 MED ORDER — ROSUVASTATIN 5 MG TAB
5 mg | ORAL_TABLET | Freq: Every evening | ORAL | 0 refills | Status: DC
Start: 2021-03-09 — End: 2021-06-20

## 2021-04-04 NOTE — Telephone Encounter (Signed)
Chief Complaint   Patient presents with    Immunization/Injection     Vaccine :  Monkeypox.      Appointment     Requesting to change to a virtual .      Patient is scheduled for 04/12/2021.  Inquiring if the monkeypox vaccine is available at our office, if not would like appointment to be virtual.  Grier Mitts, LPN    Our office does not have/ give  the monkeypox vaccine.  Grier Mitts, LPN    Patient would like visit to be virtual.  Grier Mitts, LPN

## 2021-04-04 NOTE — Telephone Encounter (Signed)
-----   Message from Franki Cabot sent at 04/04/2021 10:23 AM EDT -----  Subject: Message to Provider    QUESTIONS  Information for Provider? Levi [name] would like to know if the vaccine for   Monkeypox is available and if not if he could change his appt on 8/9 to a   VV. Please contact him to let him know.  ---------------------------------------------------------------------------  --------------  Cleotis Lema INFO  [phone]; OK to leave message on voicemail  ---------------------------------------------------------------------------  --------------  SCRIPT ANSWERS  Relationship to Patient? Self

## 2021-04-05 NOTE — Telephone Encounter (Signed)
Left VM for pt to call office back. If pt calls back please let him know the office does not offer/give monkeypox vaccines. And that his 04/12/21 appt with Laurence Spates can not be switch to virtual because she doesn't do virtual appts at this time. If pt appt has to do with something acute he can be scheduled virtual with another available provider. 08/02/22TM

## 2021-04-08 NOTE — Telephone Encounter (Signed)
Do you do Virtual Visits  ??

## 2021-04-08 NOTE — Telephone Encounter (Signed)
-----   Message from [place] Teegarden-Norman sent at 04/08/2021 10:54 AM EDT -----  Subject: Message to Provider    QUESTIONS  Information for Provider? Would like to change his upcoming appointment to   a virtual one.   ---------------------------------------------------------------------------  --------------  Levi [name] INFO  [phone]; OK to leave message on voicemail  ---------------------------------------------------------------------------  --------------  SCRIPT ANSWERS  Relationship to Patient? Self

## 2021-04-08 NOTE — Telephone Encounter (Signed)
I thought NP [place] doesn't do VV's?

## 2021-04-12 ENCOUNTER — Telehealth: Admit: 2021-04-12 | Discharge: 2021-04-12 | Payer: MEDICAID | Attending: Family | Primary: Family

## 2021-04-12 ENCOUNTER — Telehealth: Attending: Family | Primary: Family

## 2021-04-12 DIAGNOSIS — E785 Hyperlipidemia, unspecified: Secondary | ICD-10-CM

## 2021-04-12 NOTE — Progress Notes (Signed)
Levi [name] is a 40 y.o. male who was seen by synchronous (real-time) audio-video technology on 04/12/2021 for Hypertension and Cholesterol Problem        Assessment & Plan:   Diagnoses and all orders for this visit:    1. Hyperlipidemia LDL goal <130  -     LIPID PANEL; Future    2. HTN (hypertension), benign  -     METABOLIC PANEL, COMPREHENSIVE; Future      I spent at least 15 minutes on this visit with this established patient.    Subjective:       Prior to Admission medications    Medication Sig Start Date End Date Taking? Authorizing Provider   propranoloL (INDERAL) 60 mg tablet TAKE 1 TABLET BY MOUTH TWICE DAILY 03/09/21   Braydyn Schultes, Daleen Snook, NP   rosuvastatin (CRESTOR) 5 mg tablet TAKE 1 TABLET BY MOUTH NIGHTLY 03/09/21   Kace Hartje, Daleen Snook, NP   loratadine (CLARITIN) 10 mg tablet TAKE 1 TABLET BY MOUTH EVERY DAY AS NEEDED FOR ALLERGIES 02/11/21   Addiel Mccardle, Daleen Snook, NP   ammonium lactate (AMLACTIN) 12 % topical cream APPLY TO THE AFFECTED AREA ON ARMS TWICE DAILY 12/31/20   Provider, Historical   ketoconazole (NIZORAL) 2 % topical cream APPLY TO THE AFFECTED AREA ON THE CHEST TWICE DAILY 12/31/20   Provider, Historical   lisinopriL (PRINIVIL, ZESTRIL) 5 mg tablet Take 1 Tablet by mouth two (2) times a day. 01/18/21   Symeon Puleo, Daleen Snook, NP   valACYclovir (VALTREX) 1 gram tablet Take 1 Tablet by mouth three (3) times daily. 01/02/21   Garron Eline, Daleen Snook, NP   bictegrav-emtricit-tenofov ala (Biktarvy) tab tablet Take 1 Tablet by mouth daily. 10/14/20   Provider, Historical   olopatadine (PATADAY) 0.2 % drop ophthalmic solution  11/10/20   Provider, Historical   azelastine (ASTEPRO) 205.5 mcg (0.15 %) 1 Spray by Both Nostrils route daily.    Provider, Historical   fexofenadine (ALLEGRA) 180 mg tablet Take 1 Tablet by mouth daily. 12/29/20   Veva Grimley, Daleen Snook, NP   butalbital-acetaminophen-caffeine (FIORICET, ESGIC) 50-325-40 mg per tablet Take 1 Tablet by mouth every six (6) hours as needed for Headache. 12/29/20    Laurence Spates, NP     Patient Active Problem List   Diagnosis Code    Hemorrhoids K64.9    Headache(784.0) R51    Chronic allergic rhinitis J30.9    Microscopic hematuria R31.29    HIV positive (HCC) Z21    Acne vulgaris L70.0    HTN (hypertension), benign I10     Patient Active Problem List    Diagnosis Date Noted    HTN (hypertension), benign 12/20/2015    HIV positive (HCC) 07/24/2014    Acne vulgaris 07/24/2014    Microscopic hematuria 03/02/2014    Hemorrhoids     Headache(784.0)     Chronic allergic rhinitis      Current Outpatient Medications   Medication Sig Dispense Refill    propranoloL (INDERAL) 60 mg tablet TAKE 1 TABLET BY MOUTH TWICE DAILY 180 Tablet 0    rosuvastatin (CRESTOR) 5 mg tablet TAKE 1 TABLET BY MOUTH NIGHTLY 90 Tablet 0    loratadine (CLARITIN) 10 mg tablet TAKE 1 TABLET BY MOUTH EVERY DAY AS NEEDED FOR ALLERGIES 30 Tablet 5    ammonium lactate (AMLACTIN) 12 % topical cream APPLY TO THE AFFECTED AREA ON ARMS TWICE DAILY      ketoconazole (NIZORAL) 2 % topical cream APPLY TO THE AFFECTED AREA ON THE CHEST TWICE DAILY  lisinopriL (PRINIVIL, ZESTRIL) 5 mg tablet Take 1 Tablet by mouth two (2) times a day. 180 Tablet 0    valACYclovir (VALTREX) 1 gram tablet Take 1 Tablet by mouth three (3) times daily. 21 Tablet 0    bictegrav-emtricit-tenofov ala (Biktarvy) tab tablet Take 1 Tablet by mouth daily.      olopatadine (PATADAY) 0.2 % drop ophthalmic solution       azelastine (ASTEPRO) 205.5 mcg (0.15 %) 1 Spray by Both Nostrils route daily.      fexofenadine (ALLEGRA) 180 mg tablet Take 1 Tablet by mouth daily. 30 Tablet 2    butalbital-acetaminophen-caffeine (FIORICET, ESGIC) 50-325-40 mg per tablet Take 1 Tablet by mouth every six (6) hours as needed for Headache. 30 Tablet 0     Allergies   Allergen Reactions    Emtricitab-Rilpivir-Tenofo Ala Other (comments)     Reaction Type: Allergy; Severity: Mild; Reaction(s): Urticaria. Rash     Past Medical History:   Diagnosis Date    Asthma      denies asthma    Chronic allergic rhinitis     Contact dermatitis and other eczema, due to unspecified cause     GERD (gastroesophageal reflux disease)     Headache(784.0) 2010    Hemorrhoids 07-2010    HIV infection (HCC)     Hypertension      History reviewed. No pertinent surgical history.  Family History   Problem Relation Age of Onset    Headache Mother     Hypertension Mother     Breast Cancer Mother         survivor    Migraines Mother     Heart Disease Father     Hypertension Father     Heart Attack Father     Heart Disease Paternal Grandfather      Social History     Tobacco Use    Smoking status: Never    Smokeless tobacco: Never   Substance Use Topics    Alcohol use: Yes     Comment: occasionally       Review of Systems   Constitutional: Negative.    HENT: Negative.     Respiratory: Negative.     Cardiovascular: Negative.    Gastrointestinal: Negative.    Genitourinary: Negative.    Musculoskeletal: Negative.      Objective:     Patient states thathe is staying home to avoid getting Covid and other types of infections     [INSTRUCTIONS:  "[x] " Indicates a positive item  "[] " Indicates a negative item  -- DELETE ALL ITEMS NOT EXAMINED]    Constitutional: [x]  Appears well-developed and well-nourished [x]  No apparent distress      []  Abnormal -     Mental status: [x]  Alert and awake  [x]  Oriented to person/place/time [x]  Able to follow commands    []  Abnormal -     Eyes:   EOM    [x]   Normal    []  Abnormal -   Sclera  [x]   Normal    []  Abnormal -          Discharge [x]   None visible   []  Abnormal -     HENT: [x]  Normocephalic, atraumatic  []  Abnormal -   [x]  Mouth/Throat: Mucous membranes are moist    External Ears [x]  Normal  []  Abnormal -    Neck: [x]  No visualized mass []  Abnormal -     Pulmonary/Chest: [x]  Respiratory effort normal   [x]  No visualized  signs of difficulty breathing or respiratory distress        []  Abnormal -      Musculoskeletal:   [x]  Normal gait with no signs of ataxia         [x]   Normal range of motion of neck        []  Abnormal -     Neurological:        [x]  No Facial Asymmetry (Cranial nerve 7 motor function) (limited exam due to video visit)          [x]  No gaze palsy        []  Abnormal -          Skin:        [x]  No significant exanthematous lesions or discoloration noted on facial skin         []  Abnormal -            Psychiatric:       [x]  Normal Affect []  Abnormal -        [x]  No Hallucinations    Other pertinent observable physical exam findings:-        We discussed the expected course, resolution and complications of the diagnosis(es) in detail.  Medication risks, benefits, costs, interactions, and alternatives were discussed as indicated.  I advised him to contact the office if his condition worsens, changes or fails to improve as anticipated. He expressed understanding with the diagnosis(es) and plan.       Levi [name], was evaluated through a synchronous (real-time) audio-video encounter. The patient (or guardian if applicable) is aware that this is a billable service, which includes applicable co-pays. This Virtual Visit was conducted with patient's (and/or legal guardian's) consent. The visit was conducted pursuant to the emergency declaration under the and the , 1135 waiver authority and the and Act.  Patient identification was verified, and a caregiver was present when appropriate.  The patient was located at: Home: [address]  [place]   The provider was located at: Home: @[facility] @        , NP

## 2021-04-22 ENCOUNTER — Encounter

## 2021-04-22 MED ORDER — LISINOPRIL 5 MG TAB
5 mg | ORAL_TABLET | ORAL | 0 refills | Status: DC
Start: 2021-04-22 — End: 2021-07-12

## 2021-04-22 MED ORDER — HYDROXYZINE 10 MG TAB
10 mg | ORAL_TABLET | ORAL | 2 refills | Status: DC
Start: 2021-04-22 — End: 2021-07-12

## 2021-06-20 ENCOUNTER — Encounter

## 2021-06-20 MED ORDER — ROSUVASTATIN 5 MG TAB
5 mg | ORAL_TABLET | Freq: Every evening | ORAL | 0 refills | Status: AC
Start: 2021-06-20 — End: ?

## 2021-06-20 MED ORDER — PROPRANOLOL 60 MG TAB
60 mg | ORAL_TABLET | ORAL | 0 refills | Status: AC
Start: 2021-06-20 — End: ?

## 2021-07-11 ENCOUNTER — Encounter

## 2021-07-12 MED ORDER — LISINOPRIL 5 MG TAB
5 mg | ORAL_TABLET | ORAL | 0 refills | Status: AC
Start: 2021-07-12 — End: ?

## 2021-07-12 MED ORDER — HYDROXYZINE 10 MG TAB
10 mg | ORAL_TABLET | ORAL | 2 refills | Status: AC
Start: 2021-07-12 — End: ?

## 2022-02-15 ENCOUNTER — Other Ambulatory Visit: Payer: Self-pay

## 2022-02-15 ENCOUNTER — Emergency Department (HOSPITAL_COMMUNITY): Payer: 59

## 2022-02-15 ENCOUNTER — Emergency Department (HOSPITAL_COMMUNITY)
Admission: EM | Admit: 2022-02-15 | Discharge: 2022-02-16 | Disposition: A | Discharge status: Home / Self Care | Payer: 59 | Attending: Emergency Medicine | Admitting: Emergency Medicine

## 2022-02-15 ENCOUNTER — Ambulatory Visit (INDEPENDENT_AMBULATORY_CARE_PROVIDER_SITE_OTHER): Payer: 59 | Admitting: Student

## 2022-02-15 ENCOUNTER — Encounter (HOSPITAL_COMMUNITY): Payer: Self-pay | Admitting: Pharmacy Technician

## 2022-02-15 VITALS — BP 169/112 | HR 74 | Ht 66.0 in | Wt 160.4 lb

## 2022-02-15 DIAGNOSIS — R55 Syncope and collapse: Secondary | ICD-10-CM | POA: Diagnosis not present

## 2022-02-15 DIAGNOSIS — R0789 Other chest pain: Secondary | ICD-10-CM | POA: Diagnosis present

## 2022-02-15 DIAGNOSIS — I10 Essential (primary) hypertension: Secondary | ICD-10-CM | POA: Insufficient documentation

## 2022-02-15 DIAGNOSIS — Z21 Asymptomatic human immunodeficiency virus [HIV] infection status: Secondary | ICD-10-CM | POA: Insufficient documentation

## 2022-02-15 DIAGNOSIS — Z20822 Contact with and (suspected) exposure to covid-19: Secondary | ICD-10-CM | POA: Insufficient documentation

## 2022-02-15 DIAGNOSIS — R110 Nausea: Secondary | ICD-10-CM | POA: Insufficient documentation

## 2022-02-15 DIAGNOSIS — R079 Chest pain, unspecified: Secondary | ICD-10-CM | POA: Insufficient documentation

## 2022-02-15 DIAGNOSIS — Z79899 Other long term (current) drug therapy: Secondary | ICD-10-CM | POA: Insufficient documentation

## 2022-02-15 DIAGNOSIS — B20 Human immunodeficiency virus [HIV] disease: Secondary | ICD-10-CM

## 2022-02-15 HISTORY — DX: Essential (primary) hypertension: I10

## 2022-02-15 LAB — COMPREHENSIVE METABOLIC PANEL
ALT: 14 U/L (ref 0–44)
AST: 14 U/L — ABNORMAL LOW (ref 15–41)
Albumin: 4.4 g/dL (ref 3.5–5.0)
Alkaline Phosphatase: 45 U/L (ref 38–126)
Anion gap: 10 (ref 5–15)
BUN: 16 mg/dL (ref 6–20)
CO2: 23 mmol/L (ref 22–32)
Calcium: 9.7 mg/dL (ref 8.9–10.3)
Chloride: 107 mmol/L (ref 98–111)
Creatinine, Ser: 1.23 mg/dL (ref 0.61–1.24)
GFR, Estimated: >60 mL/min (ref >60)
Glucose, Bld: 79 mg/dL (ref 70–99)
Potassium: 4 mmol/L (ref 3.5–5.1)
Sodium: 140 mmol/L (ref 135–145)
Total Bilirubin: 0.7 mg/dL (ref 0.3–1.2)
Total Protein: 7.1 g/dL (ref 6.5–8.1)

## 2022-02-15 LAB — CBC WITH DIFFERENTIAL/PLATELET
Abs Immature Granulocytes: 0.02 10*3/uL (ref 0.00–0.07)
Basophils Absolute: 0.1 10*3/uL (ref 0.0–0.1)
Basophils Relative: 1 %
Eosinophils Absolute: 0.2 10*3/uL (ref 0.0–0.5)
Eosinophils Relative: 2 %
HCT: 41.7 % (ref 39.0–52.0)
Hemoglobin: 13.4 g/dL (ref 13.0–17.0)
Immature Granulocytes: 0 %
Lymphocytes Relative: 27 %
Lymphs Abs: 1.9 10*3/uL (ref 0.7–4.0)
MCH: 30.5 pg (ref 26.0–34.0)
MCHC: 32.1 g/dL (ref 30.0–36.0)
MCV: 94.8 fL (ref 80.0–100.0)
Monocytes Absolute: 0.5 10*3/uL (ref 0.1–1.0)
Monocytes Relative: 7 %
Neutro Abs: 4.3 10*3/uL (ref 1.7–7.7)
Neutrophils Relative %: 63 %
Platelets: 296 10*3/uL (ref 150–400)
RBC: 4.4 MIL/uL (ref 4.22–5.81)
RDW: 12.7 % (ref 11.5–15.5)
WBC: 7 10*3/uL (ref 4.0–10.5)
nRBC: 0 % (ref 0.0–0.2)

## 2022-02-15 LAB — TROPONIN I (HIGH SENSITIVITY)
Troponin I (High Sensitivity): 4 ng/L (ref <18)
Troponin I (High Sensitivity): 5 ng/L (ref <18)

## 2022-02-15 LAB — RESP PANEL BY RT-PCR (FLU A&B, COVID) ARPGX2
Influenza A by PCR: NEGATIVE
Influenza B by PCR: NEGATIVE
SARS Coronavirus 2 by RT PCR: NEGATIVE

## 2022-02-15 NOTE — Assessment & Plan Note (Signed)
Known HIV + patient since 2014 on Biktarvy, unsure of lab levels. Ambulatory referral sent to ID for patient outpatient.  Sent to ED this visit given symptoms, see chest pain.

## 2022-02-15 NOTE — ED Triage Notes (Signed)
Pt here with chest pain, weakness, near syncope and nausea onset Friday.

## 2022-02-15 NOTE — Assessment & Plan Note (Signed)
Patient has been taking propranolol, has lisinopril and outside medication list but has not been taking.  Blood pressure elevated this afternoon with 169/112.  We will have patient go to emergency room for further evaluation, see chest pain.

## 2022-02-15 NOTE — Assessment & Plan Note (Addendum)
Given nature of pain and associated symptoms with complexity of medical history, I believe it is appropriate for the patient to be further evaluated and worked up in the emergency room.  Reassuringly, EKG is normal sinus rhythm and vitals are stable while in clinic.  It appears that there is a component of anxious mood.  However, I am unsure of the patient true adherence to [facility] and he has not had labs nor has he seen infectious disease in approximately 1 year.  Patient likely needs chest x-ray, basic lab work, +/- an echo given his chest pain, presyncope.  It appears that the patient is stable enough to be taken via wheelchair by CMA, patient understands and would like to continue with this plan.

## 2022-02-15 NOTE — ED Provider Triage Note (Signed)
Emergency Medicine Provider Triage Evaluation Note  Joshua Martin , a 41 y.o. male  was evaluated in triage.  Pt complains of fatigue, lightheadedness, right-sided chest pain, and shortness of breath.  Denies fever.  Reports compliance with his HIV drug regimen.  Unsure of his recent CD4 count.  Review of Systems  Positive: As above Negative: As above  Physical Exam  BP (!) 172/109   Pulse (!) 58   Temp 98.3 F (36.8 C) (Oral)   Resp 18   SpO2 100%  Gen:   Awake, no distress   Resp:  Normal effort  MSK:   Moves extremities without difficulty  Other:    Medical Decision Making  Medically screening exam initiated at 3:39 PM.  Appropriate orders placed.  Gustavo Dispenza was informed that the remainder of the evaluation will be completed by another provider, this initial triage assessment does not replace that evaluation, and the importance of remaining in the ED until their evaluation is complete.     Marita Kansas, PA-C 02/15/22 1540

## 2022-02-15 NOTE — Progress Notes (Signed)
Subjective:    Patient ID: Joshua Martin, male    DOB: [birth year], 41 y.o.   MRN: [id number]   CC: Establish care  HPI:  Joshua Martin is a very pleasant 41 y.o. male who presents today to establish care.  Initial concerns:  - Fatigue since Friday, 02/10/22.  - eating well but has had episodes of dry heaving since onset of symptoms  - light headed, exhausted, heart beating really fast, chest pain as well since Friday - chest pain is constant and feels like pressure in right side of chest  - felt short of breath on Friday - felt like he would pass out at work   - had to go home on Friday  - no LOC or syncopal episodes    Got health insurance in March 2023 and moved here in November 2022 No sick contacts recently   Patient is HIV positive and has been since 12/2012, was seen by [facility] infectious disease last year.  He reports that he was last seen approximately 1 year ago and has not had labs since.  He has been taking his Biktarvy and has not missed doses.  Past medical history: HTN, HLD, allergic rhinitis, dermatitis,  HIV infection  Past surgical history: none   Current medications: Propranolol, Biktarvy, Hydroxyzine, Zyrtec   Family history: No family history on file.  Social history: Has a partner and moved to [place] recently. No drug use or alcohol use in the past several weeks. Only eats edibles, no other illicit drug use.   ROS: pertinent noted in the HPI   Objective:  BP (!) 169/112   Pulse 74   Ht 5\' 6"  (1.676 m)   Wt 160 lb 6 oz (72.7 kg)   SpO2 98%   BMI 25.89 kg/m   Vitals and nursing note reviewed  General: Extremely diaphoretic, able to participate in exam, weak but responsive appropriately; slightly anxious  Cardiac: RRR, S1 S2 present. normal heart sounds, no murmurs. Respiratory: CTAB, normal effort, No wheezes, rales or rhonchi Abdomen: Bowel sounds present, non-distended Extremities: no edema or cyanosis. Skin: warm and dry, no rashes  noted Neuro: alert, weakness 2/5 muscle strength bilaterally UE and 3/5 BLE, no abnormalities in sensation  Assessment & Plan:    Chest pain Given nature of pain and associated symptoms with complexity of medical history, I believe it is appropriate for the patient to be further evaluated and worked up in the emergency room.  Reassuringly, EKG is normal sinus rhythm and vitals are stable while in clinic.  It appears that there is a component of anxious mood.  However, I am unsure of the patient true adherence to [facility] and he has not had labs nor has he seen infectious disease in approximately 1 year.  Patient likely needs chest x-ray, basic lab work, +/- an echo given his chest pain, presyncope.  It appears that the patient is stable enough to be taken via wheelchair by CMA, patient understands and would like to continue with this plan.  Hypertension Patient has been taking propranolol, has lisinopril and outside medication list but has not been taking.  Blood pressure elevated this afternoon with 169/112.  We will have patient go to emergency room for further evaluation, see chest pain.  HIV infection (HCC) Known HIV + patient since 2014 on Biktarvy, unsure of lab levels. Ambulatory referral sent to ID for patient outpatient.  Sent to ED this visit given symptoms, see chest pain.    Will need sleep study  order and healthcare maintenance checks on next visit.   Alfredo Martinez, MD  [facility] PGY-1

## 2022-02-16 ENCOUNTER — Ambulatory Visit (INDEPENDENT_AMBULATORY_CARE_PROVIDER_SITE_OTHER): Payer: 59 | Admitting: Family Medicine

## 2022-02-16 VITALS — BP 142/105 | HR 66 | Ht 66.0 in | Wt 163.0 lb

## 2022-02-16 DIAGNOSIS — R079 Chest pain, unspecified: Secondary | ICD-10-CM

## 2022-02-16 DIAGNOSIS — I10 Essential (primary) hypertension: Secondary | ICD-10-CM

## 2022-02-16 DIAGNOSIS — R519 Headache, unspecified: Secondary | ICD-10-CM

## 2022-02-16 DIAGNOSIS — B20 Human immunodeficiency virus [HIV] disease: Secondary | ICD-10-CM | POA: Diagnosis not present

## 2022-02-16 MED ORDER — NAPROXEN 500 MG PO TABS: 500.0000 mg | ORAL_TABLET | Freq: Two times a day (BID) | ORAL | 0 refills | Status: AC

## 2022-02-16 MED ORDER — METOPROLOL SUCCINATE ER 50 MG PO TB24
50.0000 mg | ORAL_TABLET | Freq: Every day | ORAL | 3 refills | Status: DC
  Filled 2022-06-07: qty 90, 90d supply, fill #0
  Filled 2022-11-13: qty 90, 90d supply, fill #1

## 2022-02-16 NOTE — ED Provider Notes (Signed)
[facility] EMERGENCY DEPARTMENT Provider Note   CSN: [id number] Arrival date & time: 02/15/22  1509     History  Chief Complaint  Patient presents with   Chest Pain   Near Syncope    Joshua Martin is a 41 y.o. male.  The history is provided by the patient and medical records.  Chest Pain Associated symptoms: near-syncope   Near Syncope Associated symptoms include chest pain.  Joshua Martin is a 41 y.o. male who presents to the Emergency Department complaining of this pain.  He presents to the emergency department from clinic for evaluation of right-sided chest pain that started on Friday.  Pain is described as a soreness type feeling that is worse with movement.  Overall the pain is improving.  He does have associated nausea today, that has since resolved.  No difficulty breathing, abdominal pain, fevers, cough, leg swelling or pain.  He has a history of hypertension, hyperlipidemia, HIV.  No history of prior DVT/PE.  No recent surgery or travel.  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    Home Medications Prior to Admission medications   Medication Sig Start Date End Date Taking? Authorizing Provider  ergocalciferol (VITAMIN D2) 1.25 MG (50000 UT) capsule Take one tablet every month 12/06/20   [provider]  hydrOXYzine (ATARAX) 10 MG tablet TAKE 1 TABLET BY MOUTH 3 TIMES A DAY AS NEEDED FOR AGITATION FOR UP TO 10 DAY GENERIC ATARAX 01/18/21   [provider]  propranolol (INDERAL) 60 MG tablet Take 1 tablet by mouth 2 (two) times daily. 07/02/20   [provider]      Allergies    Patient has no known allergies.    Review of Systems   Review of Systems  Cardiovascular:  Positive for chest pain and near-syncope.  All other systems reviewed and are negative.   Physical Exam Updated Vital Signs BP 125/90 (BP Location: Right Arm)   Pulse 60   Temp 98.3 F (36.8 C) (Oral)   Resp 18   SpO2 99%  Physical  Exam Vitals and nursing note reviewed.  Constitutional:      Appearance: He is well-developed.  HENT:     Head: Normocephalic and atraumatic.  Cardiovascular:     Rate and Rhythm: Normal rate and regular rhythm.     Heart sounds: No murmur heard. Pulmonary:     Effort: Pulmonary effort is normal. No respiratory distress.     Breath sounds: Normal breath sounds.     Comments: Right upper chest wall tenderness to palpation Abdominal:     Palpations: Abdomen is soft.     Tenderness: There is no abdominal tenderness. There is no guarding or rebound.  Musculoskeletal:        General: No swelling or tenderness.  Skin:    General: Skin is warm and dry.  Neurological:     Mental Status: He is alert and oriented to person, place, and time.  Psychiatric:        Behavior: Behavior normal.     ED Results / Procedures / Treatments   Labs (all labs ordered are listed, but only abnormal results are displayed) Labs Reviewed  COMPREHENSIVE METABOLIC PANEL - Abnormal; Notable for the following components:      Result Value   AST 14 (*)    All other components within normal limits  RESP PANEL BY RT-PCR (FLU A&B, COVID) ARPGX2  CBC WITH DIFFERENTIAL/PLATELET  TROPONIN I (HIGH SENSITIVITY)  TROPONIN I (  HIGH SENSITIVITY)    EKG EKG Interpretation  Date/Time:  Wednesday February 15 2022 15:27:45 EDT Ventricular Rate:  60 PR Interval:  182 QRS Duration: 96 QT Interval:  386 QTC Calculation: 386 R Axis:   72 Text Interpretation: Normal sinus rhythm Normal ECG No previous ECGs available Confirmed by Tilden Fossa [phone]) on 02/16/2022 1:41:44 AM  Radiology DG Chest 2 View  Result Date: 02/15/2022 CLINICAL DATA:  Chest pain; syncopechest pain EXAM: CHEST - 2 VIEW COMPARISON:  None FINDINGS: Normal mediastinum and cardiac silhouette. Normal pulmonary vasculature. No evidence of effusion, infiltrate, or pneumothorax. No acute bony abnormality. IMPRESSION: Normal chest radiograph  Electronically Signed   By: Genevive Bi M.D.   On: 02/15/2022 16:07    Procedures Procedures    Medications Ordered in ED Medications - No data to display  ED Course/ Medical Decision Making/ A&P                           Medical Decision Making  Patient here for evaluation of right-sided chest pain since Friday.  EKG is within normal limits.  Troponins are negative x2.  Doubt PE he is PERC negative.  He was hypertensive in the office but his blood pressure is normalized at time of ED evaluation.  Current clinical picture is not consistent with ACS, PE, dissection.  Feel he is stable to discharge home with outpatient PCP follow-up as well as return precautions.       Final Clinical Impression(s) / ED Diagnoses Final diagnoses:  Atypical chest pain    Rx / DC Orders ED Discharge Orders     None         Tilden Fossa, MD 02/16/22 0321

## 2022-02-16 NOTE — Patient Instructions (Addendum)
It was wonderful to see you today.  Please bring ALL of your medications with you to every visit.   Today we referred to cardiology. We have scheduled you for an echo as indicated below.  Stop propranolol, start metoprolol follow up in 1 week for blood pressure check. I am sending you for a sleep study. They will call to get this schedule.   Please be sure to schedule follow up at the front  desk before you leave today.   Please call the clinic at [phone] if your symptoms worsen or you have any concerns. It was our pleasure to serve you.  Dr. Salvadore Dom

## 2022-02-16 NOTE — Progress Notes (Signed)
SUBJECTIVE:   CHIEF COMPLAINT / HPI:   Chest pain Seen in ED yesterday for atypical chest pain.  Right-sided chest pain that started last Friday it is a sore feeling and worse with movement seems to be improving.  Endorsing occasional nausea and headache.  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  States that he does not workout or lift weights on a regular basis.  Did not hear an audible pop or crack prior to the onset of chest pain.   Hypertension - Medications: Propranolol 60 mg twice daily - Compliance: Yes - Checking BP at home: No - Denies any SOB, CP, vision changes, LE edema, medication SEs, or symptoms of hypotension - Endorses headache in which he has a history of migraine headaches but states he wakes up sometimes gasping for air and would like to have a sleep study done.   History of HIV - Follow with ID in [place]; recently moved here has not establish care with ID - Last viral load was last year - Endorses compliance with Biktarvy   PERTINENT  PMH / PSH: Environmental allergies  OBJECTIVE:   BP (!) 142/105   Pulse 66   Ht 5\' 6"  (1.676 m)   Wt 163 lb (73.9 kg)   SpO2 100%   BMI 26.31 kg/m   Physical Exam Vitals reviewed.  Constitutional:      General: He is not in acute distress.    Appearance: He is not ill-appearing, toxic-appearing or diaphoretic.  Cardiovascular:     Rate and Rhythm: Normal rate and regular rhythm.     Pulses: Normal pulses.     Heart sounds: Normal heart sounds.  Pulmonary:     Effort: Pulmonary effort is normal. No respiratory distress.     Breath sounds: Normal breath sounds.     Comments: Mouth breather Musculoskeletal:     Right lower leg: No edema.     Left lower leg: No edema.  Neurological:     Mental Status: He is alert and oriented to person, place, and time.  Psychiatric:        Mood and Affect: Mood normal.        Behavior: Behavior normal.     ASSESSMENT/PLAN:   1. Chest pain, unspecified type Improving.  Not at rest.  Located on the right side associated with right upper extremity movement.  Reviewed ED course and EKG from yesterday which is sinus rhythm.  Higher suspicion for musculoskeletal chest wall pain given HPI and nature of symptoms.  But will also complete desired cardiac work-up such as echo and referral to cardiologist for possible stress test.  NSAID for chest wall pain. - ECHOCARDIOGRAM COMPLETE; Future - Ambulatory referral to Cardiology - naproxen (NAPROSYN) 500 MG tablet; Take 1 tablet (500 mg total) by mouth 2 (two) times daily with a meal.  Dispense: 14 tablet; Refill: 0  2. Hypertension, unspecified type Uncontrolled.  On propranolol from prior doctor in likely for mostly migraine prophylaxis.  We will switch to metoprolol for dual purpose.  Also consider ARB's if continues to be uncontrolled and need a second agent. - Discontinue propranolol -Start metoprolol succinate (TOPROL-XL) 50 MG 24 hr tablet; Take 1 tablet (50 mg total) by mouth daily. Take with or immediately following a meal.  Dispense: 90 tablet; Refill: 3 - Follow-up in 1-2 weeks for blood pressure check  3. HIV infection, unspecified symptom status (  HCC) Reviewed outside records, viral load 1 year prior was undetectable - Referral to ID previously made - HIV-1 RNA quant-no reflex-bld  4. Nonintractable headache, unspecified chronicity pattern, unspecified headache type Endorsing symptoms of sleep apnea.  He is a mouth breather.  Currently taking Zyrtec for allergies.  Consider optimizing allergy symptoms at follow-up. - metoprolol succinate (TOPROL-XL) 50 MG 24 hr tablet; Take 1 tablet (50 mg total) by mouth daily. Take with or immediately following a meal.  Dispense: 90 tablet; Refill: 3 - Nocturnal polysomnography (NPSG); Future    Lavonda Jumbo, DO [facility]

## 2022-02-16 NOTE — Progress Notes (Addendum)
FPTS Consult Note  Patient seen in clinic with right sided chest pain and sent over to [facility] for evaluation. ED evaluated patient, he had normal troponin's and EKG and his pain is reproducible. Spoke with ED doc about d/c patient with follow up tomorrow in [facility].   O: BP (!) 124/95 (BP Location: Right Arm)   Pulse 65   Temp 98 F (36.7 C) (Oral)   Resp 17   SpO2 99%   General: Well appearing, tired, NAD, African American male Resp: CTABL Card: RRR, [facility]  A/P: Atypical Chest pain Patient is stable, with appropriate vitals, and a work up reassuring, low risk heart score of 1 for hypertension (risk of MACE 0.9-1.7%). Patient reasonable for discharge with close follow up for coordination of outpatient cardiology work up and healthcare maintenance. Patient will need further work up for his chest pain. Will plan to see patient in clinic tomorrow, at which point patient will need: -Referral to cardiology for stress test -Echo, lipid panel -Blood pressure management -Referral to ID for HIV management.  Bess Kinds, MD 02/16/2022, 2:19 AM PGY-1, [facility] Service pager [phone]  FPTS Upper-Level Resident Addendum   I have independently interviewed and examined the patient. I have discussed the above with the original author and agree with their documentation. I have made additional edits as needed.   Shirlean Mylar, M.D. PGY-3, [facility] 02/16/2022 2:52 AM  FPTS Service pager: [phone] (text pages welcome through [facility])

## 2022-02-17 ENCOUNTER — Telehealth: Payer: Self-pay | Admitting: Student

## 2022-02-17 ENCOUNTER — Other Ambulatory Visit: Payer: Self-pay | Admitting: Student

## 2022-02-17 ENCOUNTER — Ambulatory Visit (HOSPITAL_COMMUNITY)
Admission: RE | Admit: 2022-02-17 | Discharge: 2022-02-17 | Disposition: A | Discharge status: Home / Self Care | Payer: 59 | Source: Ambulatory Visit | Attending: Family Medicine | Admitting: Family Medicine

## 2022-02-17 DIAGNOSIS — I10 Essential (primary) hypertension: Secondary | ICD-10-CM | POA: Diagnosis not present

## 2022-02-17 DIAGNOSIS — R079 Chest pain, unspecified: Secondary | ICD-10-CM | POA: Diagnosis present

## 2022-02-17 DIAGNOSIS — R519 Headache, unspecified: Secondary | ICD-10-CM | POA: Insufficient documentation

## 2022-02-17 LAB — ECHOCARDIOGRAM COMPLETE
AR max vel: 3.93 cm2
AV Peak grad: 4.2 mmHg
Ao pk vel: 1.03 m/s
Area-P 1/2: 4.41 cm2
S' Lateral: 3.87 cm
Single Plane A4C EF: 43 %

## 2022-02-17 MED ORDER — HYDROXYZINE HCL 10 MG PO TABS: ORAL_TABLET | ORAL | 1 refills | Status: DC

## 2022-02-17 NOTE — Progress Notes (Signed)
Patient had previously been at Atarax for anxety, has been previously by me. Will rx via patient request.

## 2022-02-17 NOTE — Telephone Encounter (Signed)
Patient came in stating that he needs a refill on his Hydroxyzine, he is completely out.

## 2022-02-18 LAB — HIV-1 RNA QUANT-NO REFLEX-BLD: HIV-1 RNA Viral Load: <20 copies/mL

## 2022-02-27 ENCOUNTER — Telehealth: Payer: Self-pay | Admitting: Family Medicine

## 2022-02-28 ENCOUNTER — Other Ambulatory Visit: Payer: Self-pay

## 2022-02-28 ENCOUNTER — Ambulatory Visit: Payer: 59

## 2022-02-28 ENCOUNTER — Other Ambulatory Visit (HOSPITAL_COMMUNITY)
Admission: RE | Admit: 2022-02-28 | Discharge: 2022-02-28 | Disposition: A | Discharge status: Home / Self Care | Payer: 59 | Source: Ambulatory Visit | Attending: Internal Medicine | Admitting: Internal Medicine

## 2022-02-28 ENCOUNTER — Other Ambulatory Visit: Payer: 59

## 2022-02-28 DIAGNOSIS — Z79899 Other long term (current) drug therapy: Secondary | ICD-10-CM | POA: Insufficient documentation

## 2022-02-28 DIAGNOSIS — Z113 Encounter for screening for infections with a predominantly sexual mode of transmission: Secondary | ICD-10-CM | POA: Insufficient documentation

## 2022-02-28 DIAGNOSIS — B20 Human immunodeficiency virus [HIV] disease: Secondary | ICD-10-CM

## 2022-03-01 LAB — URINE CYTOLOGY ANCILLARY ONLY
Chlamydia: NEGATIVE
Comment: NEGATIVE
Comment: NORMAL
Neisseria Gonorrhea: NEGATIVE

## 2022-03-01 LAB — T-HELPER CELL (CD4) - (RCID CLINIC ONLY)
CD4 % Helper T Cell: 35 % (ref 33–65)
CD4 T Cell Abs: 693 /uL (ref 400–1790)

## 2022-03-06 ENCOUNTER — Encounter: Payer: Self-pay | Admitting: Internal Medicine

## 2022-03-08 LAB — CBC WITH DIFFERENTIAL/PLATELET
Absolute Monocytes: 483 cells/uL (ref 200–950)
Basophils Absolute: 43 cells/uL (ref 0–200)
Basophils Relative: 0.6 %
Eosinophils Absolute: 92 cells/uL (ref 15–500)
Eosinophils Relative: 1.3 %
HCT: 36.1 % — ABNORMAL LOW (ref 38.5–50.0)
Hemoglobin: 12.2 g/dL — ABNORMAL LOW (ref 13.2–17.1)
Lymphs Abs: 1747 cells/uL (ref 850–3900)
MCH: 31.3 pg (ref 27.0–33.0)
MCHC: 33.8 g/dL (ref 32.0–36.0)
MCV: 92.6 fL (ref 80.0–100.0)
MPV: 10.6 fL (ref 7.5–12.5)
Monocytes Relative: 6.8 %
Neutro Abs: 4736 cells/uL (ref 1500–7800)
Neutrophils Relative %: 66.7 %
Platelets: 264 10*3/uL (ref 140–400)
RBC: 3.9 10*6/uL — ABNORMAL LOW (ref 4.20–5.80)
RDW: 12 % (ref 11.0–15.0)
Total Lymphocyte: 24.6 %
WBC: 7.1 10*3/uL (ref 3.8–10.8)

## 2022-03-08 LAB — HEPATITIS A ANTIBODY, TOTAL: Hepatitis A AB,Total: REACTIVE — AB

## 2022-03-08 LAB — HLA B*5701: HLA-B*5701 w/rflx HLA-B High: NEGATIVE

## 2022-03-08 LAB — COMPLETE METABOLIC PANEL WITH GFR
AG Ratio: 2.2 (calc) (ref 1.0–2.5)
ALT: 11 U/L (ref 9–46)
AST: 14 U/L (ref 10–40)
Albumin: 4.6 g/dL (ref 3.6–5.1)
Alkaline phosphatase (APISO): 49 U/L (ref 36–130)
BUN/Creatinine Ratio: 13 (calc) (ref 6–22)
BUN: 17 mg/dL (ref 7–25)
CO2: 26 mmol/L (ref 20–32)
Calcium: 9.3 mg/dL (ref 8.6–10.3)
Chloride: 107 mmol/L (ref 98–110)
Creat: 1.31 mg/dL — ABNORMAL HIGH (ref 0.60–1.29)
Globulin: 2.1 g/dL (calc) (ref 1.9–3.7)
Glucose, Bld: 71 mg/dL (ref 65–99)
Potassium: 4 mmol/L (ref 3.5–5.3)
Sodium: 141 mmol/L (ref 135–146)
Total Bilirubin: 0.4 mg/dL (ref 0.2–1.2)
Total Protein: 6.7 g/dL (ref 6.1–8.1)
eGFR: 69 mL/min/{1.73_m2} (ref >=60)

## 2022-03-08 LAB — URINALYSIS
Bilirubin Urine: NEGATIVE
Glucose, UA: NEGATIVE
Ketones, ur: NEGATIVE
Leukocytes,Ua: NEGATIVE
Nitrite: NEGATIVE
Protein, ur: NEGATIVE
Specific Gravity, Urine: 1.02 (ref 1.001–1.035)
pH: <=5 (ref 5.0–8.0)

## 2022-03-08 LAB — LIPID PANEL
Cholesterol: 188 mg/dL (ref <200)
HDL: 54 mg/dL (ref >=40)
LDL Cholesterol (Calc): 122 mg/dL (calc) — ABNORMAL HIGH
Non-HDL Cholesterol (Calc): 134 mg/dL (calc) — ABNORMAL HIGH (ref <130)
Total CHOL/HDL Ratio: 3.5 (calc) (ref <5.0)
Triglycerides: 38 mg/dL (ref <150)

## 2022-03-08 LAB — QUANTIFERON-TB GOLD PLUS
Mitogen-NIL: >10 IU/mL
NIL: 0.14 IU/mL
QuantiFERON-TB Gold Plus: NEGATIVE
TB1-NIL: 0.02 IU/mL
TB2-NIL: 0.01 IU/mL

## 2022-03-08 LAB — HEPATITIS B SURFACE ANTIGEN: Hepatitis B Surface Ag: NONREACTIVE

## 2022-03-08 LAB — HEPATITIS B SURFACE ANTIBODY,QUALITATIVE: Hep B S Ab: NONREACTIVE

## 2022-03-08 LAB — HIV-1 RNA ULTRAQUANT REFLEX TO GENTYP+
HIV 1 RNA Quant: NOT DETECTED copies/mL
HIV-1 RNA Quant, Log: NOT DETECTED Log copies/mL

## 2022-03-08 LAB — HEPATITIS B CORE ANTIBODY, TOTAL: Hep B Core Total Ab: NONREACTIVE

## 2022-03-08 LAB — RPR: RPR Ser Ql: NONREACTIVE

## 2022-03-08 LAB — HEPATITIS C ANTIBODY: Hepatitis C Ab: NONREACTIVE

## 2022-03-16 ENCOUNTER — Other Ambulatory Visit (HOSPITAL_COMMUNITY): Payer: Self-pay

## 2022-03-17 ENCOUNTER — Ambulatory Visit (INDEPENDENT_AMBULATORY_CARE_PROVIDER_SITE_OTHER): Payer: 59 | Admitting: Internal Medicine

## 2022-03-17 ENCOUNTER — Ambulatory Visit (INDEPENDENT_AMBULATORY_CARE_PROVIDER_SITE_OTHER): Payer: 59 | Admitting: Pharmacist

## 2022-03-17 ENCOUNTER — Other Ambulatory Visit (HOSPITAL_COMMUNITY): Payer: Self-pay

## 2022-03-17 ENCOUNTER — Telehealth: Payer: Self-pay | Admitting: Pharmacist

## 2022-03-17 ENCOUNTER — Encounter: Payer: Self-pay | Admitting: Internal Medicine

## 2022-03-17 ENCOUNTER — Other Ambulatory Visit: Payer: Self-pay

## 2022-03-17 ENCOUNTER — Other Ambulatory Visit (HOSPITAL_COMMUNITY)
Admission: RE | Admit: 2022-03-17 | Discharge: 2022-03-17 | Disposition: A | Discharge status: Home / Self Care | Payer: 59 | Source: Ambulatory Visit | Attending: Internal Medicine | Admitting: Internal Medicine

## 2022-03-17 ENCOUNTER — Telehealth: Payer: Self-pay

## 2022-03-17 VITALS — BP 140/89 | HR 72 | Temp 98.3°F | Ht 66.0 in | Wt 163.0 lb

## 2022-03-17 DIAGNOSIS — Z113 Encounter for screening for infections with a predominantly sexual mode of transmission: Secondary | ICD-10-CM

## 2022-03-17 DIAGNOSIS — Z23 Encounter for immunization: Secondary | ICD-10-CM

## 2022-03-17 DIAGNOSIS — Z3009 Encounter for other general counseling and advice on contraception: Secondary | ICD-10-CM | POA: Insufficient documentation

## 2022-03-17 DIAGNOSIS — R319 Hematuria, unspecified: Secondary | ICD-10-CM | POA: Insufficient documentation

## 2022-03-17 DIAGNOSIS — B20 Human immunodeficiency virus [HIV] disease: Secondary | ICD-10-CM

## 2022-03-17 DIAGNOSIS — Z7185 Encounter for immunization safety counseling: Secondary | ICD-10-CM | POA: Insufficient documentation

## 2022-03-17 MED ORDER — BIKTARVY 50-200-25 MG PO TABS: 1.0000 | ORAL_TABLET | Freq: Every day | ORAL | 5 refills | Status: DC

## 2022-03-17 NOTE — Assessment & Plan Note (Signed)
Patient reports an interest in adoption in the future and asked for resources today.  Advised I would look into what options may be available and provide resources at follow up.

## 2022-03-17 NOTE — Progress Notes (Signed)
HPI: Joshua Martin is a 41 y.o. male who presents to the [facility] pharmacy clinic to transfer HIV care.  Patient Active Problem List   Diagnosis Date Noted   Vaccine counseling 03/17/2022   Routine screening for STI (sexually transmitted infection) 03/17/2022   Hematuria 03/17/2022   Family planning 03/17/2022   Nonintractable headache 02/17/2022   Chest pain 02/15/2022   HIV infection (HCC) 02/15/2022   Hypertension     Patient's Medications  New Prescriptions   No medications on file  Previous Medications   AMMONIUM LACTATE (AMLACTIN) 12 % CREAM    APPLY TO THE AFFECTED AREA ON ARMS TWICE DAILY   AZELASTINE HCL 0.15 % SOLN    Place into the nose.   BETAMETHASONE VALERATE OINTMENT (VALISONE) 0.1 %       BICTEGRAVIR-EMTRICITABINE-TENOFOVIR AF (BIKTARVY) 50-200-25 MG TABS TABLET    Take 1 tablet by mouth daily.   ERGOCALCIFEROL (VITAMIN D2) 1.25 MG (50000 UT) CAPSULE    Take one tablet every month   FEXOFENADINE (ALLEGRA) 180 MG TABLET    Take by mouth.   FLUTICASONE (FLONASE) 50 MCG/ACT NASAL SPRAY       HYDROXYZINE (ATARAX) 10 MG TABLET    TAKE 1 TABLET BY MOUTH 3 TIMES A DAY AS NEEDED FOR AGITATION FOR UP TO 10 DAY GENERIC ATARAX   KETOCONAZOLE (NIZORAL) 2 % CREAM    APPLY TO THE AFFECTED AREA ON THE CHEST TWICE DAILY   METOPROLOL SUCCINATE (TOPROL-XL) 50 MG 24 HR TABLET    Take 1 tablet (50 mg total) by mouth daily. Take with or immediately following a meal.   NAPROXEN (NAPROSYN) 500 MG TABLET    Take 1 tablet (500 mg total) by mouth 2 (two) times daily with a meal.   OLOPATADINE HCL 0.2 % SOLN    ceived the following from [facility]: Outside name: olopatadine (PATADAY) 0.2 % drop ophthalmic solution  Modified Medications   No medications on file  Discontinued Medications   No medications on file    Allergies: No Known Allergies  Past Medical History: Past Medical History:  Diagnosis Date   Hypertension     Social History: Social History    Socioeconomic History   Marital status: Single    Spouse name: Not on file   Number of children: Not on file   Years of education: Not on file   Highest education level: Not on file  Occupational History   Not on file  Tobacco Use   Smoking status: Never   Smokeless tobacco: Never  Substance and Sexual Activity   Alcohol use: Yes    Comment: once or twice per week   Drug use: Yes    Types: Marijuana    Comment: "once in a blue moon"   Sexual activity: Not on file    Comment: declined condoms  Other Topics Concern   Not on file  Social History Narrative   Not on file   Social Determinants of Health   Financial Resource Strain: Not on file  Food Insecurity: Not on file  Transportation Needs: Not on file  Physical Activity: Not on file  Stress: Not on file  Social Connections: Not on file    Labs: Lab Results  Component Value Date   HIV1RNAQUANT NOT DETECTED 02/28/2022   HIV1RNAVL <20 02/16/2022   CD4TABS 693 02/28/2022    RPR and STI Lab Results  Component Value Date   LABRPR NON-REACTIVE 02/28/2022    STI Results GC  CT  02/28/2022  2:34 PM Negative  Negative     Hepatitis B Lab Results  Component Value Date   HEPBSAB NON-REACTIVE 02/28/2022   HEPBSAG NON-REACTIVE 02/28/2022   HEPBCAB NON-REACTIVE 02/28/2022   Hepatitis C Lab Results  Component Value Date   HEPCAB NON-REACTIVE 02/28/2022   Hepatitis A Lab Results  Component Value Date   HAV REACTIVE (A) 02/28/2022   Lipids: Lab Results  Component Value Date   CHOL 188 02/28/2022   TRIG 38 02/28/2022   HDL 54 02/28/2022   CHOLHDL 3.5 02/28/2022   LDLCALC 122 (H) 02/28/2022    Current HIV Regimen: Biktarvy  Assessment: Stiles presents to clinic today to transfer HIV care with Dr. Earlene Plater. He has been maintained on Biktarvy with excellent virologic control. He is interested in [place]. Will look into insurance coverage for him and follow-up based on that information.  Counseled that  [place] is two separate intramuscular injections in the gluteal muscle on each side for each visit. Explained that the second injection is 30 days after the initial injection then every 2 months thereafter. Discussed the need for viral load monitoring every 2 months for the first 6 months and then periodically afterwards as their provider sees the need. Discussed the rare but significant chance of developing resistance despite compliance. Explained that showing up to injection appointments is very important and warned that if 2 appointments are missed, it will be reassessed by their provider whether they are a good candidate for injection therapy. Counseled on possible side effects associated with the injections such as injection site pain, which is usually mild to moderate in nature, injection site nodules, and injection site reactions. Asked to call the clinic or send me a mychart message if they experience any issues, such as fatigue, nausea, headache, rash, or dizziness. Advised that they can take ibuprofen or tylenol for injection site pain if needed.   Plan: Continue Biktarvy Follow-up on Cabenuva approval  Margarite Gouge, PharmD, CPP, BCIDP Clinical Pharmacist Practitioner Infectious Diseases Clinical Pharmacist [facility] 03/17/2022, 3:56 PM

## 2022-03-17 NOTE — Progress Notes (Signed)
[facility]  Reason for Consult: HIV new patient Referring Provider: Dr Jena Gauss   HPI:    Joshua Martin is a 41 y.o. male with a past medical history as below who presents to clinic as a new patient for HIV care.    Patient was previously in care in [place], [place] and currently on [place].  His HIV has been under good long term control and lab work last month indicated his viral load is undetectable and CD4 count is healthy.   HIV diagnosed: April 2014 Prior ART: Triumeq Current ART: Biktarvy (Started 09/2018) Hx of OI: none documented Hx of STI: GC, CT, Syphilis Risk factors: MSM  Genotype Hx: - none recorded  Patient's Medications  New Prescriptions   No medications on file  Previous Medications   AMMONIUM LACTATE (AMLACTIN) 12 % CREAM    APPLY TO THE AFFECTED AREA ON ARMS TWICE DAILY   AZELASTINE HCL 0.15 % SOLN    Place into the nose.   BETAMETHASONE VALERATE OINTMENT (VALISONE) 0.1 %       ERGOCALCIFEROL (VITAMIN D2) 1.25 MG (50000 UT) CAPSULE    Take one tablet every month   FEXOFENADINE (ALLEGRA) 180 MG TABLET    Take by mouth.   FLUTICASONE (FLONASE) 50 MCG/ACT NASAL SPRAY       HYDROXYZINE (ATARAX) 10 MG TABLET    TAKE 1 TABLET BY MOUTH 3 TIMES A DAY AS NEEDED FOR AGITATION FOR UP TO 10 DAY GENERIC ATARAX   KETOCONAZOLE (NIZORAL) 2 % CREAM    APPLY TO THE AFFECTED AREA ON THE CHEST TWICE DAILY   METOPROLOL SUCCINATE (TOPROL-XL) 50 MG 24 HR TABLET    Take 1 tablet (50 mg total) by mouth daily. Take with or immediately following a meal.   NAPROXEN (NAPROSYN) 500 MG TABLET    Take 1 tablet (500 mg total) by mouth 2 (two) times daily with a meal.   OLOPATADINE HCL 0.2 % SOLN    ceived the following from [facility]: Outside name: olopatadine (PATADAY) 0.2 % drop ophthalmic solution  Modified Medications   Modified Medication Previous Medication   BICTEGRAVIR-EMTRICITABINE-TENOFOVIR AF (BIKTARVY) 50-200-25 MG TABS  TABLET bictegravir-emtricitabine-tenofovir AF (BIKTARVY) 50-200-25 MG TABS tablet      Take 1 tablet by mouth daily.    Take 1 tablet by mouth daily.  Discontinued Medications   No medications on file      Past Medical History:  Diagnosis Date   Hypertension     Social History   Tobacco Use   Smoking status: Never   Smokeless tobacco: Never  Substance Use Topics   Alcohol use: Yes    Comment: once or twice per week   Drug use: Yes    Types: Marijuana    Comment: "once in a blue moon"    No family history on file.  No Known Allergies  Review of Systems  Constitutional: Negative.   Respiratory: Negative.    Cardiovascular: Negative.   Gastrointestinal: Negative.      OBJECTIVE:    Vitals:   03/17/22 1358  BP: 140/89  Pulse: 72  Temp: 98.3 F (36.8 C)  TempSrc: Oral  SpO2: 100%  Weight: 163 lb (73.9 kg)  Height: 5\' 6"  (1.676 m)     Body mass index is 26.31 kg/m.   Physical Exam Constitutional:      Appearance: Normal appearance.  HENT:     Head: Normocephalic and atraumatic.  Pulmonary:     Effort: Pulmonary effort is normal. No respiratory distress.  Musculoskeletal:        General: Normal range of motion.     Cervical back: Normal range of motion and neck supple.  Skin:    General: Skin is warm and dry.  Neurological:     General: No focal deficit present.     Mental Status: He is alert and oriented to person, place, and time.  Psychiatric:        Mood and Affect: Mood normal.        Behavior: Behavior normal.     Labs and Microbiology:    Latest Ref Rng & Units 02/28/2022    2:23 AM 02/15/2022    3:43 PM  CMP  Glucose 65 - 99 mg/dL 71  79   BUN 7 - 25 mg/dL 17  16   Creatinine 2.99 - 1.29 mg/dL 3.71  6.96   Sodium 789 - 146 mmol/L 141  140   Potassium 3.5 - 5.3 mmol/L 4.0  4.0   Chloride 98 - 110 mmol/L 107  107   CO2 20 - 32 mmol/L 26  23   Calcium 8.6 - 10.3 mg/dL 9.3  9.7   Total Protein 6.1 - 8.1 g/dL 6.7  7.1   Total  Bilirubin 0.2 - 1.2 mg/dL 0.4  0.7   Alkaline Phos 38 - 126 U/L  45   AST 10 - 40 U/L 14  14   ALT 9 - 46 U/L 11  14       Latest Ref Rng & Units 02/28/2022    2:23 AM 02/15/2022    3:43 PM  CBC  WBC 3.8 - 10.8 Thousand/uL 7.1  7.0   Hemoglobin 13.2 - 17.1 g/dL 38.1  01.7   Hematocrit 38.5 - 50.0 % 36.1  41.7   Platelets 140 - 400 Thousand/uL 264  296      Lab Results  Component Value Date   HIV1RNAQUANT NOT DETECTED 02/28/2022   HIV1RNAVL <20 02/16/2022   CD4TABS 693 02/28/2022    RPR and STI: Lab Results  Component Value Date   LABRPR NON-REACTIVE 02/28/2022    STI Results GC CT  02/28/2022  2:34 PM Negative  Negative     Hepatitis B: Lab Results  Component Value Date   HEPBSAB NON-REACTIVE 02/28/2022   HEPBSAG NON-REACTIVE 02/28/2022   HEPBCAB NON-REACTIVE 02/28/2022   Hepatitis C: Lab Results  Component Value Date   HEPCAB NON-REACTIVE 02/28/2022   Hepatitis A: Lab Results  Component Value Date   HAV REACTIVE (A) 02/28/2022   Lipids: Lab Results  Component Value Date   CHOL 188 02/28/2022   TRIG 38 02/28/2022   HDL 54 02/28/2022   CHOLHDL 3.5 02/28/2022   LDLCALC 122 (H) 02/28/2022       ASSESSMENT & PLAN:    HIV infection (HCC) Patient has excellent long term control of his HIV infection currently on Biktarvy.  He is interested in [place] [facility].  Discussed this with patient and what injections would entail and he remains interested.  He has no Genotypes available so will check a Genosure Archive to assess for any archived resistance.  Will follow up in 6 weeks to discuss transition to [place].  Will also have patient fill out dentistry form today to establish with our dentistry clinic.  Routine screening for STI (sexually transmitted infection) Will check oral and rectal GC/CT.  Hematuria Patient has had this before and states was seen by  urology in [place] with unremarkable work up.  Offered new referral today for urology here in [place] but he  declines at this time and would rather continue to monitor.   Family planning Patient reports an interest in adoption in the future and asked for resources today.  Advised I would look into what options may be available and provide resources at follow up.     Vedia Coffer for Infectious Disease [facility] 03/17/2022, 2:40 PM      I have personally spent 60 minutes involved in face-to-face and non-face-to-face activities for this patient on the day of the visit. Professional time spent includes the following activities: Preparing to see the patient (review of tests), Obtaining and/or reviewing separately obtained history (admission/discharge record), Performing a medically appropriate examination and/or evaluation , Ordering medications/tests/procedures, referring and communicating with other health care professionals, Documenting clinical information in the EMR, Independently interpreting results (not separately reported), Communicating results to the patient/family/caregiver, Counseling and educating the patient/family/caregiver and Care coordination (not separately reported).

## 2022-03-17 NOTE — Patient Instructions (Signed)
Thank you for coming to see me today. It was a pleasure seeing you.  To Do: Swabs today Pneumonia vaccine Labs Follow up in 6 weeks to discuss Cabenuva injection Please follow up with your PCP regarding refills on your other medications  If you have any questions or concerns, please do not hesitate to call the office at [phone].  Take Care,   Gwynn Burly

## 2022-03-17 NOTE — Telephone Encounter (Signed)
[facility] Patient Advocate Encounter   Received notification from [facility] that prior authorization for CABENUVA is required.   PA submitted on 03/17/2022 Key [id number] Status is pending    [facility] Clinic will continue to follow.   Clearance Coots, CPhT Specialty Pharmacy Patient [facility] for Infectious Disease Phone: [phone] Fax:  [phone]

## 2022-03-17 NOTE — Telephone Encounter (Signed)
Patient interested in [place]; provided counseling during today's visit. Lupita Leash, can you look into Cabenuva coverage for him?  Thanks!  Margarite Gouge, PharmD, CPP, BCIDP Clinical Pharmacist Practitioner Infectious Diseases Clinical Pharmacist [facility] for Infectious Disease

## 2022-03-17 NOTE — Assessment & Plan Note (Signed)
Patient has had this before and states was seen by urology in [place] with unremarkable work up.  Offered new referral today for urology here in [place] but he declines at this time and would rather continue to monitor.

## 2022-03-17 NOTE — Assessment & Plan Note (Signed)
Will check oral and rectal GC/CT.

## 2022-03-17 NOTE — Addendum Note (Signed)
Addended by: Primus Bravo E on: 03/17/2022 03:19 PM   Modules accepted: Orders

## 2022-03-17 NOTE — Assessment & Plan Note (Signed)
Patient has excellent long term control of his HIV infection currently on Biktarvy.  He is interested in [place] [facility].  Discussed this with patient and what injections would entail and he remains interested.  He has no Genotypes available so will check a Genosure Archive to assess for any archived resistance.  Will follow up in 6 weeks to discuss transition to [place].  Will also have patient fill out dentistry form today to establish with our dentistry clinic.

## 2022-03-17 NOTE — Addendum Note (Signed)
Addended by: Wyvonne Lenz on: 03/17/2022 03:43 PM   Modules accepted: Orders

## 2022-03-20 ENCOUNTER — Telehealth: Payer: Self-pay | Admitting: Pharmacist

## 2022-03-20 ENCOUNTER — Other Ambulatory Visit (HOSPITAL_COMMUNITY): Payer: Self-pay

## 2022-03-20 LAB — CYTOLOGY, (ORAL, ANAL, URETHRAL) ANCILLARY ONLY
Chlamydia: NEGATIVE
Chlamydia: NEGATIVE
Comment: NEGATIVE
Comment: NEGATIVE
Comment: NORMAL
Comment: NORMAL
Neisseria Gonorrhea: NEGATIVE
Neisseria Gonorrhea: NEGATIVE

## 2022-03-20 NOTE — Telephone Encounter (Signed)
Patient's Cabenuva PA was denied through [employer]. Wrote and faxed appeal; awaiting response.  Margarite Gouge, PharmD, CPP, BCIDP Clinical Pharmacist Practitioner Infectious Diseases Clinical Pharmacist [facility] for Infectious Disease

## 2022-03-22 ENCOUNTER — Other Ambulatory Visit: Payer: 59

## 2022-03-22 ENCOUNTER — Other Ambulatory Visit: Payer: Self-pay

## 2022-03-24 ENCOUNTER — Telehealth: Payer: Self-pay

## 2022-03-24 NOTE — Telephone Encounter (Signed)
[facility] Patient Advocate Encounter   Received notification from [facility] that prior authorization for Joshua Martin  is required.   PA submitted on 03/24/22 Key [id number]  J-code [id number] will need a prior auth  Fax Chart Notes & Labs to [phone] Phone # [phone] Status is pending    [facility] Clinic will continue to follow.   Clearance Coots, CPhT Specialty Pharmacy Patient [facility] for Infectious Disease Phone: [phone] Fax:  [phone]

## 2022-03-27 ENCOUNTER — Ambulatory Visit: Payer: 59 | Admitting: Cardiology

## 2022-04-11 NOTE — Progress Notes (Unsigned)
Cardiology Office Note:    Date:  04/11/2022   ID:  Joshua Martin, DOB [birth year], MRN [id number]  PCP:  Alfredo Martinez, MD   [facility] Providers Cardiologist:  None {  Referring MD: Westley Chandler, MD    History of Present Illness:    Joshua Martin is a 41 y.o. male with a hx of HTN and HIV who was referred by Dr. Manson Passey for further evaluation of chest pain.   Patient was seen by Dr. Manson Passey in 02/16/22 where he was having episodes of chest pain that was worse with movement. Note reviewed. Was seen in the ER the day prior where work-up was reassuring with normal trops. ECG nonischemic.  Thought to be MSK in nature but given risk factors, was referred to Cardiology for further evaluation.  Has since has a TTE which showed LVEF 45-50%, normal RV, no significant valve disease.  Today, ***  Past Medical History:  Diagnosis Date   Chest pain    Hypertension     No past surgical history on file.  Current Medications: No outpatient medications have been marked as taking for the 04/17/22 encounter (Appointment) with Meriam Sprague, MD.     Allergies:   Patient has no known allergies.   Social History   Socioeconomic History   Marital status: Single    Spouse name: Not on file   Number of children: Not on file   Years of education: Not on file   Highest education level: Not on file  Occupational History   Not on file  Tobacco Use   Smoking status: Never   Smokeless tobacco: Never  Substance and Sexual Activity   Alcohol use: Yes    Comment: once or twice per week   Drug use: Yes    Types: Marijuana    Comment: "once in a blue moon"   Sexual activity: Not on file    Comment: declined condoms  Other Topics Concern   Not on file  Social History Narrative   Not on file   Social Determinants of Health   Financial Resource Strain: Not on file  Food Insecurity: Not on file  Transportation Needs: Not on file  Physical Activity: Not on file   Stress: Not on file  Social Connections: Not on file     Family History: The patient's ***family history is not on file.  ROS:   Please see the history of present illness.    *** All other systems reviewed and are negative.  EKGs/Labs/Other Studies Reviewed:    The following studies were reviewed today: TTE 22-Feb-2022: IMPRESSIONS     1. Left ventricular ejection fraction, by estimation, is 45 to 50%. The  left ventricle has mildly decreased function. The left ventricle  demonstrates global hypokinesis. Left ventricular diastolic parameters are  indeterminate.   2. Right ventricular systolic function is normal. The right ventricular  size is normal.   3. Left atrial size was mildly dilated.   4. The mitral valve is normal in structure. No evidence of mitral valve  regurgitation. No evidence of mitral stenosis.   5. The aortic valve is tricuspid. Aortic valve regurgitation is not  visualized. No aortic stenosis is present.   6. The inferior vena cava is normal in size with <50% respiratory  variability, suggesting right atrial pressure of 8 mmHg.   Comparison(s): No prior Echocardiogram.   Conclusion(s)/Recommendation(s): EF mildly reduced, with mild global  hypokinesis.   EKG:  EKG is *** ordered today.  The ekg ordered today demonstrates ***  Recent Labs: 02/28/2022: ALT 11; BUN 17; Creat 1.31; Hemoglobin 12.2; Platelets 264; Potassium 4.0; Sodium 141  Recent Lipid Panel    Component Value Date/Time   CHOL 188 02/28/2022 0223   TRIG 38 02/28/2022 0223   HDL 54 02/28/2022 0223   CHOLHDL 3.5 02/28/2022 0223   LDLCALC 122 (H) 02/28/2022 0223     Risk Assessment/Calculations:   {Does this patient have ATRIAL FIBRILLATION?:[id number]}       Physical Exam:    VS:  There were no vitals taken for this visit.    Wt Readings from Last 3 Encounters:  03/17/22 163 lb (73.9 kg)  02/16/22 163 lb (73.9 kg)  02/15/22 160 lb 6 oz (72.7 kg)     GEN: *** Well  nourished, well developed in no acute distress HEENT: Normal NECK: No JVD; No carotid bruits LYMPHATICS: No lymphadenopathy CARDIAC: ***RRR, no murmurs, rubs, gallops RESPIRATORY:  Clear to auscultation without rales, wheezing or rhonchi  ABDOMEN: Soft, non-tender, non-distended MUSCULOSKELETAL:  No edema; No deformity  SKIN: Warm and dry NEUROLOGIC:  Alert and oriented x 3 PSYCHIATRIC:  Normal affect   ASSESSMENT:    No diagnosis found. PLAN:    In order of problems listed above:  #Newly Diagnosed Systolic HF: TTE 56/4332 with LVEF 45-50%.  -Check coronary CTA -Continue metop 50mg  XL daily -Add entresto*** -Start spironolactone 12.5mg  daily  #Chest Pain: Atypical in nature with reassuring ER work-up. Given mildly reduced LVEF, will plan to check coronary CTA for further evaluation.  #HTN: -Continue metop 50mg  XL daily      {Are you ordering a CV Procedure (e.g. stress test, cath, DCCV, TEE, etc)?   Press F2        :    Medication Adjustments/Labs and Tests Ordered: Current medicines are reviewed at length with the patient today.  Concerns regarding medicines are outlined above.  No orders of the defined types were placed in this encounter.  No orders of the defined types were placed in this encounter.   There are no Patient Instructions on file for this visit.   Signed, , MD  04/11/2022 2:47 PM     [facility]

## 2022-04-17 ENCOUNTER — Encounter: Payer: Self-pay | Admitting: Cardiology

## 2022-04-17 ENCOUNTER — Telehealth: Payer: Self-pay

## 2022-04-17 ENCOUNTER — Ambulatory Visit (INDEPENDENT_AMBULATORY_CARE_PROVIDER_SITE_OTHER): Payer: 59 | Admitting: Cardiology

## 2022-04-17 VITALS — BP 132/96 | HR 73 | Ht 66.0 in | Wt 164.0 lb

## 2022-04-17 DIAGNOSIS — I10 Essential (primary) hypertension: Secondary | ICD-10-CM | POA: Diagnosis not present

## 2022-04-17 DIAGNOSIS — R079 Chest pain, unspecified: Secondary | ICD-10-CM | POA: Diagnosis not present

## 2022-04-17 NOTE — Telephone Encounter (Signed)
-----   Message from Joshua Martinez, MD sent at 04/17/2022  1:59 PM EDT ----- Please schedule appt with me for anxiety follow up, not urgent

## 2022-04-17 NOTE — Telephone Encounter (Signed)
Contacted the pt to schedule follow up, however pt cell phone went starlight to vm and I was unable to leave message

## 2022-04-17 NOTE — Patient Instructions (Signed)
Medication Instructions:   Your physician recommends that you continue on your current medications as directed. Please refer to the Current Medication list given to you today.  *If you need a refill on your cardiac medications before your next appointment, please call your pharmacy*   Follow-Up:  AS NEEDED WITH DR. PEMBERTON   Important Information About Sugar       

## 2022-04-27 ENCOUNTER — Encounter: Payer: Self-pay | Admitting: Pharmacist

## 2022-04-27 ENCOUNTER — Other Ambulatory Visit (HOSPITAL_COMMUNITY): Payer: Self-pay

## 2022-04-27 ENCOUNTER — Telehealth: Payer: Self-pay

## 2022-04-27 NOTE — Telephone Encounter (Signed)
[facility] Patient Advocate Encounter  Prior Authorization for Joshua Martin has been approved.  (Medical Benefits)  PA# [id number] Effective dates: 03/24/22 through 03/25/23  Patients co-pay is $80.00 office visit.   Patient is enrolled in [employer] Portal Claims      [facility] Clinic will continue to follow.  Clearance Coots, CPhT Specialty Pharmacy Patient [facility] for Infectious Disease Phone: [phone] Fax:  [phone]

## 2022-04-28 NOTE — Telephone Encounter (Signed)
FYI

## 2022-05-31 ENCOUNTER — Other Ambulatory Visit (HOSPITAL_COMMUNITY): Payer: Self-pay

## 2022-06-07 ENCOUNTER — Other Ambulatory Visit: Payer: Self-pay

## 2022-06-07 ENCOUNTER — Telehealth: Payer: Self-pay | Admitting: Pharmacist

## 2022-06-07 ENCOUNTER — Ambulatory Visit (INDEPENDENT_AMBULATORY_CARE_PROVIDER_SITE_OTHER): Payer: 59 | Admitting: Internal Medicine

## 2022-06-07 ENCOUNTER — Encounter: Payer: Self-pay | Admitting: Internal Medicine

## 2022-06-07 ENCOUNTER — Other Ambulatory Visit (HOSPITAL_COMMUNITY): Payer: Self-pay

## 2022-06-07 ENCOUNTER — Telehealth: Payer: Self-pay

## 2022-06-07 VITALS — BP 147/107 | HR 84 | Resp 16 | Ht 66.0 in | Wt 175.0 lb

## 2022-06-07 DIAGNOSIS — B20 Human immunodeficiency virus [HIV] disease: Secondary | ICD-10-CM | POA: Diagnosis not present

## 2022-06-07 MED ORDER — HYDROXYZINE HCL 10 MG PO TABS
10.0000 mg | ORAL_TABLET | Freq: Three times a day (TID) | ORAL | 0 refills | Status: DC | PRN
  Filled 2022-06-07: qty 90, 30d supply, fill #0

## 2022-06-07 MED ORDER — CABOTEGRAVIR & RILPIVIRINE ER 600 & 900 MG/3ML IM SUER
1.0000 | INTRAMUSCULAR | 5 refills | Status: AC
  Filled 2022-06-07: qty 6, 60d supply, fill #0
  Filled 2022-09-01: qty 6, 30d supply, fill #0
  Filled 2022-10-27: qty 6, 30d supply, fill #1
  Filled 2022-12-22 – 2023-01-02 (×2): qty 6, 30d supply, fill #2
  Filled 2023-02-19: qty 6, 30d supply, fill #3

## 2022-06-07 MED ORDER — CABOTEGRAVIR & RILPIVIRINE ER 600 & 900 MG/3ML IM SUER
1.0000 | Freq: Once | INTRAMUSCULAR | Status: AC
  Administered 2022-06-07: 1 via INTRAMUSCULAR

## 2022-06-07 MED ORDER — CABOTEGRAVIR & RILPIVIRINE ER 600 & 900 MG/3ML IM SUER
1.0000 | INTRAMUSCULAR | 1 refills | Status: AC
  Filled 2022-06-07 (×2): qty 6, 30d supply, fill #0
  Filled 2022-06-27: qty 6, 30d supply, fill #1

## 2022-06-07 NOTE — Telephone Encounter (Signed)
A user error has taken place.

## 2022-06-07 NOTE — Progress Notes (Signed)
[place] for Infectious Disease   CHIEF COMPLAINT    HIV follow up.    SUBJECTIVE:    Joshua Martin is a 41 y.o. male with PMHx as below who presents to the clinic for HIV follow up.   Please see A&P for the details of today's visit and status of the patient's medical problems.   Patient's Medications  New Prescriptions   CABOTEGRAVIR & RILPIVIRINE ER (CABENUVA) 600 & 900 MG/3ML INJECTION    Inject 1 kit into the muscle every 30 (thirty) days.   CABOTEGRAVIR & RILPIVIRINE ER (CABENUVA) 600 & 900 MG/3ML INJECTION    Inject 1 kit into the muscle every 2 (two) months.  Previous Medications   AMMONIUM LACTATE (AMLACTIN) 12 % CREAM    APPLY TO THE AFFECTED AREA ON ARMS TWICE DAILY   AZELASTINE HCL 0.15 % SOLN    Place into the nose.   BETAMETHASONE VALERATE OINTMENT (VALISONE) 0.1 %       ERGOCALCIFEROL (VITAMIN D2) 1.25 MG (50000 UT) CAPSULE       FEXOFENADINE (ALLEGRA) 180 MG TABLET    Take by mouth.   FLUTICASONE (FLONASE) 50 MCG/ACT NASAL SPRAY       HYDROXYZINE (ATARAX) 10 MG TABLET    TAKE 1 TABLET BY MOUTH 3 TIMES A DAY AS NEEDED FOR AGITATION FOR UP TO 10 DAY GENERIC ATARAX   KETOCONAZOLE (NIZORAL) 2 % CREAM    APPLY TO THE AFFECTED AREA ON THE CHEST TWICE DAILY   METOPROLOL SUCCINATE (TOPROL-XL) 50 MG 24 HR TABLET    Take 1 tablet (50 mg total) by mouth daily. Take with or immediately following a meal.   NAPROXEN (NAPROSYN) 500 MG TABLET    Take 1 tablet (500 mg total) by mouth 2 (two) times daily with a meal.   OLOPATADINE HCL 0.2 % SOLN    ceived the following from [facility]: Outside name: olopatadine (PATADAY) 0.2 % drop ophthalmic solution  Modified Medications   No medications on file  Discontinued Medications   BICTEGRAVIR-EMTRICITABINE-TENOFOVIR AF (BIKTARVY) 50-200-25 MG TABS TABLET    Take 1 tablet by mouth daily.      Past Medical History:  Diagnosis Date   Chest pain    Hypertension     Social History   Tobacco Use    Smoking status: Never   Smokeless tobacco: Never  Substance Use Topics   Alcohol use: Yes    Comment: once or twice per week   Drug use: Yes    Types: Marijuana    Comment: "once in a blue moon"    Family History  Problem Relation Age of Onset   Coronary artery disease Father    CAD Paternal Grandmother     Allergies  Allergen Reactions   Emtricitabine-Rilpivirine-Tenofovir Af     Other reaction(s): Other (See Comments), other/intolerance Reaction Type: Allergy; Severity: Mild; Reaction(s): Urticaria. Rash Reaction Type: Allergy; Severity: Mild; Reaction(s): Urticaria. Rash Reaction Type: Allergy; Severity: Mild; Reaction(s): Urticaria. Rash Reaction Type: Allergy; Severity: Mild; Reaction(s): Urticaria. Rash     Review of Systems  Constitutional: Negative.   Gastrointestinal: Negative.   Genitourinary: Negative.   All other systems reviewed and are negative.    OBJECTIVE:    Vitals:   06/07/22 1404  BP: (!) 147/107  Pulse: 84  Resp: 16  SpO2: 95%  Weight: 175 lb (79.4 kg)  Height: '5\' 6"'  (1.676 m)     Body mass index is 28.25  kg/m.  Physical Exam Constitutional:      Appearance: Normal appearance.  Eyes:     Extraocular Movements: Extraocular movements intact.     Conjunctiva/sclera: Conjunctivae normal.  Abdominal:     General: There is no distension.     Palpations: Abdomen is soft.  Musculoskeletal:        General: Normal range of motion.  Skin:    General: Skin is warm and dry.  Neurological:     General: No focal deficit present.     Mental Status: He is alert and oriented to person, place, and time.  Psychiatric:        Mood and Affect: Mood normal.        Behavior: Behavior normal.     Labs and Microbiology:    Latest Ref Rng & Units 02/28/2022    2:23 AM 02/15/2022    3:43 PM  CMP  Glucose 65 - 99 mg/dL 71  79   BUN 7 - 25 mg/dL 17  16   Creatinine 0.60 - 1.29 mg/dL 1.31  1.23   Sodium 135 - 146 mmol/L 141  140   Potassium 3.5 -  5.3 mmol/L 4.0  4.0   Chloride 98 - 110 mmol/L 107  107   CO2 20 - 32 mmol/L 26  23   Calcium 8.6 - 10.3 mg/dL 9.3  9.7   Total Protein 6.1 - 8.1 g/dL 6.7  7.1   Total Bilirubin 0.2 - 1.2 mg/dL 0.4  0.7   Alkaline Phos 38 - 126 U/L  45   AST 10 - 40 U/L 14  14   ALT 9 - 46 U/L 11  14       Latest Ref Rng & Units 02/28/2022    2:23 AM 02/15/2022    3:43 PM  CBC  WBC 3.8 - 10.8 Thousand/uL 7.1  7.0   Hemoglobin 13.2 - 17.1 g/dL 12.2  13.4   Hematocrit 38.5 - 50.0 % 36.1  41.7   Platelets 140 - 400 Thousand/uL 264  296      Lab Results  Component Value Date   HIV1RNAQUANT NOT DETECTED 02/28/2022   HIV1RNAVL <20 02/16/2022   CD4TABS 693 02/28/2022    RPR and STI: Lab Results  Component Value Date   LABRPR NON-REACTIVE 02/28/2022    STI Results GC CT  03/17/2022  2:25 PM Negative    Negative  Negative    Negative   02/28/2022  2:34 PM Negative  Negative     Hepatitis B: Lab Results  Component Value Date   HEPBSAB NON-REACTIVE 02/28/2022   HEPBSAG NON-REACTIVE 02/28/2022   HEPBCAB NON-REACTIVE 02/28/2022   Hepatitis C: Lab Results  Component Value Date   HEPCAB NON-REACTIVE 02/28/2022   Hepatitis A: Lab Results  Component Value Date   HAV REACTIVE (A) 02/28/2022   Lipids: Lab Results  Component Value Date   CHOL 188 02/28/2022   TRIG 38 02/28/2022   HDL 54 02/28/2022   CHOLHDL 3.5 02/28/2022   LDLCALC 122 (H) 02/28/2022    Imaging:    ASSESSMENT & PLAN:    HIV infection ([place]) Here for his routine follow up appointment.  He is currently on Biktarvy and tolerating this well.  He was previously interested in [place] and this has been approved.  He remains interested in this option so will administer first injections today.  Follow up in 4 weeks for next injection and then every 8 weeks thereafter.     Orders Placed This  Encounter  Procedures   HIV-1 RNA quant-no reflex-bld     Raynelle Highland for Infectious Disease [facility] 06/07/2022, 2:44 PM     I have personally spent 30 minutes involved in face-to-face and non-face-to-face activities for this patient on the day of the visit. Professional time spent includes the following activities: Preparing to see the patient (review of tests), Obtaining and/or reviewing separately obtained history (admission/discharge record), Performing a medically appropriate examination and/or evaluation , Ordering medications/tests/procedures, referring and communicating with other health care professionals, Documenting clinical information in the EMR, Independently interpreting results (not separately reported), Communicating results to the patient/family/caregiver, Counseling and educating the patient/family/caregiver and Care coordination (not separately reported).

## 2022-06-07 NOTE — Addendum Note (Signed)
Addended by: Theresia Majors A on: 06/07/2022 03:18 PM   Modules accepted: Orders

## 2022-06-07 NOTE — Telephone Encounter (Signed)
Patient presents for first [place] injection with Dr. Juleen China today. He recently started working at [employer] in [employer]. His insurance has changed, but Butch Penny was able to get him approved through [facility]. Will send [employer] to [facility]. Will replace Cabenuva supply once his medication is delivered.   Reviewed main Cabenuva counseling points with patient as he had multiple questions today. Reviewed that he still needs to take his Biktarvy today and then stop. He knows not to massage the injection site areas and preventative medicine for pain relief. He knows he can take his medicines next door and dispense safely.   Would like to transfer his prescriptions (hydroxyzine and metoprolol) to [place] at [facility]. Patient knows he needs to call them to have medications fill after they are transferred. He is currently out of medicine.    Alfonse Spruce, PharmD, CPP, [place] Clinical Pharmacist Practitioner Infectious [place] for Infectious Disease

## 2022-06-07 NOTE — Assessment & Plan Note (Signed)
Here for his routine follow up appointment.  He is currently on Biktarvy and tolerating this well.  He was previously interested in [place] and this has been approved.  He remains interested in this option so will administer first injections today.  Follow up in 4 weeks for next injection and then every 8 weeks thereafter.

## 2022-06-08 ENCOUNTER — Telehealth: Payer: Self-pay

## 2022-06-08 NOTE — Telephone Encounter (Signed)
[facility] Patient Advocate Encounter  Patient's medication Joshua Martin) have been couriered to [facility] from [employer] and will administered on the patient next office visit on 06/07/22.  Joshua Martin , [name] [facility] Patient [facility] for Infectious Disease Phone: [phone] Fax:  [phone]

## 2022-06-10 LAB — HIV-1 RNA QUANT-NO REFLEX-BLD
HIV 1 RNA Quant: NOT DETECTED Copies/mL
HIV-1 RNA Quant, Log: NOT DETECTED Log cps/mL

## 2022-06-27 ENCOUNTER — Other Ambulatory Visit (HOSPITAL_COMMUNITY): Payer: Self-pay

## 2022-06-29 ENCOUNTER — Other Ambulatory Visit (HOSPITAL_COMMUNITY): Payer: Self-pay

## 2022-06-30 ENCOUNTER — Telehealth: Payer: Self-pay

## 2022-06-30 ENCOUNTER — Other Ambulatory Visit (HOSPITAL_COMMUNITY): Payer: Self-pay

## 2022-06-30 NOTE — Telephone Encounter (Signed)
[facility] Patient Advocate Encounter  Patient's medication (Cabenuva) have been couriered to [facility] from [facility] and will be administered on the patient next office visit on 07/05/22.  Njeri Vicente , CPhT Specialty Pharmacy Patient [facility] for Infectious Disease Phone: [phone] Fax:  [phone]  

## 2022-07-03 ENCOUNTER — Other Ambulatory Visit: Payer: Self-pay

## 2022-07-05 ENCOUNTER — Other Ambulatory Visit: Payer: Self-pay

## 2022-07-05 ENCOUNTER — Ambulatory Visit (INDEPENDENT_AMBULATORY_CARE_PROVIDER_SITE_OTHER): Payer: 59 | Admitting: Pharmacist

## 2022-07-05 DIAGNOSIS — Z23 Encounter for immunization: Secondary | ICD-10-CM | POA: Diagnosis not present

## 2022-07-05 DIAGNOSIS — Z113 Encounter for screening for infections with a predominantly sexual mode of transmission: Secondary | ICD-10-CM | POA: Diagnosis not present

## 2022-07-05 DIAGNOSIS — B20 Human immunodeficiency virus [HIV] disease: Secondary | ICD-10-CM

## 2022-07-05 MED ORDER — CABOTEGRAVIR & RILPIVIRINE ER 600 & 900 MG/3ML IM SUER
1.0000 | Freq: Once | INTRAMUSCULAR | Status: AC
  Administered 2022-07-05: 1 via INTRAMUSCULAR

## 2022-07-05 NOTE — Progress Notes (Signed)
HPI: Joshua Martin is a 41 y.o. male who presents to the [facility] for [place] administration.  Patient Active Problem List   Diagnosis Date Noted   Vaccine counseling 03/17/2022   Routine screening for STI (sexually transmitted infection) 03/17/2022   Hematuria 03/17/2022   Family planning 03/17/2022   Nonintractable headache 02/17/2022   Chest pain 02/15/2022   HIV infection ([place]) 02/15/2022   Hypertension     Patient's Medications  New Prescriptions   No medications on file  Previous Medications   AMMONIUM LACTATE (AMLACTIN) 12 % CREAM    APPLY TO THE AFFECTED AREA ON ARMS TWICE DAILY   AZELASTINE HCL 0.15 % SOLN    Place into the nose.   BETAMETHASONE VALERATE OINTMENT (VALISONE) 0.1 %       CABOTEGRAVIR & RILPIVIRINE ER (CABENUVA) 600 & 900 MG/3ML INJECTION    Inject 1 kit into the muscle every 30 (thirty) days.   CABOTEGRAVIR & RILPIVIRINE ER (CABENUVA) 600 & 900 MG/3ML INJECTION    Inject 1 kit into the muscle every 2 (two) months.   ERGOCALCIFEROL (VITAMIN D2) 1.25 MG (50000 UT) CAPSULE       FEXOFENADINE (ALLEGRA) 180 MG TABLET    Take by mouth.   FLUTICASONE (FLONASE) 50 MCG/ACT NASAL SPRAY       HYDROXYZINE (ATARAX) 10 MG TABLET    TAKE 1 TABLET BY MOUTH 3 TIMES A DAY AS NEEDED FOR AGITATION FOR UP TO 10 DAY GENERIC ATARAX   HYDROXYZINE (ATARAX) 10 MG TABLET    Take 1 tablet (10 mg total) by mouth 3 (three) times daily as needed for agitation.   KETOCONAZOLE (NIZORAL) 2 % CREAM    APPLY TO THE AFFECTED AREA ON THE CHEST TWICE DAILY   METOPROLOL SUCCINATE (TOPROL-XL) 50 MG 24 HR TABLET    Take 1 tablet (50 mg total) by mouth daily. Take with or immediately following a meal.   NAPROXEN (NAPROSYN) 500 MG TABLET    Take 1 tablet (500 mg total) by mouth 2 (two) times daily with a meal.   OLOPATADINE HCL 0.2 % SOLN    ceived the following from [facility]: Outside name: olopatadine (PATADAY) 0.2 % drop ophthalmic solution  Modified Medications    No medications on file  Discontinued Medications   No medications on file    Allergies: Allergies  Allergen Reactions   Emtricitabine-Rilpivirine-Tenofovir Af     Other reaction(s): Other (See Comments), other/intolerance Reaction Type: Allergy; Severity: Mild; Reaction(s): Urticaria. Rash Reaction Type: Allergy; Severity: Mild; Reaction(s): Urticaria. Rash Reaction Type: Allergy; Severity: Mild; Reaction(s): Urticaria. Rash Reaction Type: Allergy; Severity: Mild; Reaction(s): Urticaria. Rash     Past Medical History: Past Medical History:  Diagnosis Date   Chest pain    Hypertension     Social History: Social History   Socioeconomic History   Marital status: Single    Spouse name: Not on file   Number of children: Not on file   Years of education: Not on file   Highest education level: Not on file  Occupational History   Not on file  Tobacco Use   Smoking status: Never   Smokeless tobacco: Never  Substance and Sexual Activity   Alcohol use: Yes    Comment: once or twice per week   Drug use: Yes    Types: Marijuana    Comment: "once in a blue moon"   Sexual activity: Not on file    Comment: declined condoms  Other  Topics Concern   Not on file  Social History Narrative   Not on file   Social Determinants of Health   Financial Resource Strain: Not on file  Food Insecurity: Not on file  Transportation Needs: Not on file  Physical Activity: Not on file  Stress: Not on file  Social Connections: Not on file    Labs: Lab Results  Component Value Date   HIV1RNAQUANT Not Detected 06/07/2022   HIV1RNAQUANT NOT DETECTED 02/28/2022   HIV1RNAVL <20 02/16/2022   CD4TABS 693 02/28/2022    RPR and STI Lab Results  Component Value Date   LABRPR NON-REACTIVE 02/28/2022    STI Results GC CT  03/17/2022  2:25 PM Negative    Negative  Negative    Negative   02/28/2022  2:34 PM Negative  Negative     Hepatitis B Lab Results  Component Value Date    HEPBSAB NON-REACTIVE 02/28/2022   HEPBSAG NON-REACTIVE 02/28/2022   HEPBCAB NON-REACTIVE 02/28/2022   Hepatitis C Lab Results  Component Value Date   HEPCAB NON-REACTIVE 02/28/2022   Hepatitis A Lab Results  Component Value Date   HAV REACTIVE (A) 02/28/2022   Lipids: Lab Results  Component Value Date   CHOL 188 02/28/2022   TRIG 38 02/28/2022   HDL 54 02/28/2022   CHOLHDL 3.5 02/28/2022   LDLCALC 122 (H) 02/28/2022    TARGET DATE: 4th of each month  Assessment: Khalid presents today for his maintenance Cabenuva injections. Lucian had his first injection 06/07/22 with Dr. Juleen China and reports he tolerated the injection well, without issue except for some minor soreness when sitting.   Patient offered condoms during today's visit and politely declined.   Administered cabotegravir 63m/3mL in left upper outer quadrant of the gluteal muscle. Administered rilpivirine 900 mg/360min the right upper outer quadrant of the gluteal muscle. No issues with injections. ViAmaruill follow up in 2 months for next set of injections.  Patient is also eligible for meningococcal booster which he has consented to receive today.ViTyuslso does not have immunity to hepatitis B (despite previous vaccination), thus requiring another vaccination series for hepatitis B, which he has also consented to receive. Patient reports he has already received his influenza vaccine.    Plan: - Cabenuva injections administered - Meningococcal #1/1 and Heplisav #1/2 administered IM x1  - F/U HIV RNA, RPR  - Next injections scheduled for 09/07/22 with Dr. WaJuleen China- Call with any issues or questions  AuAdria DillPharmD PGY-2 Infectious Diseases Resident  07/05/2022 6:49 AM

## 2022-07-07 LAB — RPR: RPR Ser Ql: NONREACTIVE

## 2022-07-07 LAB — HIV-1 RNA QUANT-NO REFLEX-BLD
HIV 1 RNA Quant: NOT DETECTED Copies/mL
HIV-1 RNA Quant, Log: NOT DETECTED Log cps/mL

## 2022-08-02 ENCOUNTER — Ambulatory Visit (INDEPENDENT_AMBULATORY_CARE_PROVIDER_SITE_OTHER): Payer: 59 | Admitting: Student

## 2022-08-02 ENCOUNTER — Encounter: Payer: Self-pay | Admitting: Student

## 2022-08-02 ENCOUNTER — Other Ambulatory Visit: Payer: Self-pay

## 2022-08-02 VITALS — BP 138/85 | HR 70 | Wt 185.4 lb

## 2022-08-02 DIAGNOSIS — R632 Polyphagia: Secondary | ICD-10-CM | POA: Insufficient documentation

## 2022-08-02 DIAGNOSIS — E785 Hyperlipidemia, unspecified: Secondary | ICD-10-CM | POA: Diagnosis not present

## 2022-08-02 DIAGNOSIS — I10 Essential (primary) hypertension: Secondary | ICD-10-CM

## 2022-08-02 DIAGNOSIS — R0683 Snoring: Secondary | ICD-10-CM | POA: Diagnosis not present

## 2022-08-02 DIAGNOSIS — F419 Anxiety disorder, unspecified: Secondary | ICD-10-CM | POA: Diagnosis not present

## 2022-08-02 MED ORDER — HYDROXYZINE HCL 10 MG PO TABS
10.0000 mg | ORAL_TABLET | Freq: Three times a day (TID) | ORAL | 0 refills | Status: DC | PRN
  Filled 2022-08-02: qty 90, 30d supply, fill #0

## 2022-08-02 NOTE — Assessment & Plan Note (Signed)
Controlled Atarax metoprolol, refill for Atarax sent

## 2022-08-02 NOTE — Progress Notes (Signed)
SUBJECTIVE:   CHIEF COMPLAINT / HPI:   Hypertension: BP: 138/85 today. Home medications include: Metoprolol-- has a significantly anxious mood. He endorses taking these medications as prescribed. Does not check blood pressure at home.  Patient does report that he has had difficulty with eating, feels like he is overeating but feels unwell if he does not eat.  He has also been experiencing weight gain.  most recent creatinine trend:  Lab Results  Component Value Date   CREATININE 1.31 (H) 02/28/2022   CREATININE 1.23 02/15/2022   Patient has had a BMP in the past 1 year.  Patient does have an anxious mood and anxiety has improved with Atarax, requesting refill today.  He says he takes his Atarax before work and that it has greatly improved his quality of life.  The patient is concerned about his weight gain and would like to discuss his options.  Patient also reports that he snores at night and seems like he is choking in his sleep.  His partner has noticed that he snores very loudly and seems to wake up throughout the night.  He is requesting a sleep study.   PERTINENT  PMH / PSH: HTN     OBJECTIVE:  BP 138/85   Pulse 70   Wt 185 lb 6 oz (84.1 kg)   SpO2 100%   BMI 29.92 kg/m  Physical Exam  General: Alert and oriented in no apparent distress HEENT: No obvious goiter or thyroid deformity on exam. Heart: Regular rate and rhythm with no murmurs appreciated Lungs: CTA bilaterally, no wheezing Abdomen: Bowel sounds present, no abdominal pain Skin: Warm and dry Extremities: No lower extremity edema   ASSESSMENT/PLAN:  Anxious mood Assessment & Plan: Controlled Atarax metoprolol, refill for Atarax sent  Orders: -     hydrOXYzine HCl; Take 1 tablet (10 mg total) by mouth 3 (three) times daily as needed for agitation.  Dispense: 90 tablet; Refill: 0  Overeating Assessment & Plan: Will assess thyroid function today given the symptoms.  This could very well be a side effect  from his medications.  There are options to assist with weight gain, we will discuss this at next visit.  Orders: -     TSH Rfx on Abnormal to Free T4  Snoring Assessment & Plan: It seems that the patient may have a component of sleep apnea.  Sent referral to neurology for sleep study.  This could be contributing to his blood pressure.  Orders: -     Ambulatory referral to Neurology  Hyperlipidemia, unspecified hyperlipidemia type Assessment & Plan: Patient needs up-to-date lipid panel, given medical history with HIV, would continue with pravastatin.  Will discuss this with the patient after obtaining lipid panel.  Orders: -     Lipid panel  Hypertension, unspecified type Assessment & Plan: BP: 138/85 today. Well controlled. Goal of <130/80. Continue to work on healthy dietary habits and exercise. Follow up in 1 month.   Medication regimen: Metoprolol    On initial check of blood pressure via automatic cuff, patient's blood pressure was significantly elevated.  However, I performed a manual on a couple of occasions during the visit and the blood pressure range was between 130-140/80s.  Given the discrepancy in automatic and manual cuff, decided to continue with regular checks over the next week.  He reports that he will send me his blood pressures via MyChart, and we will alter his blood pressure regimen if indicated at that time.     Return  in about 2 months (around 10/02/2022) for HTN, overeating . Alfredo Martinez, MD 08/02/2022, 5:49 PM PGY-2, [facility]

## 2022-08-02 NOTE — Assessment & Plan Note (Signed)
Will assess thyroid function today given the symptoms.  This could very well be a side effect from his medications.  There are options to assist with weight gain, we will discuss this at next visit.

## 2022-08-02 NOTE — Patient Instructions (Addendum)
It was great to see you today! Thank you for choosing [facility] for your primary care. Joshua Martin was seen for .  Today we addressed: Please message with your updated blood pressures  I have sent a referral to the sleep specialist, they will call you  We will discuss weight loss options on next visit   If you haven't already, sign up for My Chart to have easy access to your labs results, and communication with your primary care physician.   You should return to our clinic Return in about 2 months (around 10/02/2022) for HTN, overeating . Please arrive 15 minutes before your appointment to ensure smooth check in process.  We appreciate your efforts in making this happen.  Thank you for allowing me to participate in your care, Alfredo Martinez, MD 08/02/2022, 10:47 AM PGY-2, [facility]

## 2022-08-02 NOTE — Assessment & Plan Note (Signed)
It seems that the patient may have a component of sleep apnea.  Sent referral to neurology for sleep study.  This could be contributing to his blood pressure.

## 2022-08-02 NOTE — Assessment & Plan Note (Signed)
Patient needs up-to-date lipid panel, given medical history with HIV, would continue with pravastatin.  Will discuss this with the patient after obtaining lipid panel.

## 2022-08-02 NOTE — Assessment & Plan Note (Signed)
BP: 138/85 today. Well controlled. Goal of <130/80. Continue to work on healthy dietary habits and exercise. Follow up in 1 month.   Medication regimen: Metoprolol    On initial check of blood pressure via automatic cuff, patient's blood pressure was significantly elevated.  However, I performed a manual on a couple of occasions during the visit and the blood pressure range was between 130-140/80s.  Given the discrepancy in automatic and manual cuff, decided to continue with regular checks over the next week.  He reports that he will send me his blood pressures via MyChart, and we will alter his blood pressure regimen if indicated at that time.

## 2022-08-03 ENCOUNTER — Other Ambulatory Visit: Payer: Self-pay

## 2022-08-03 LAB — LIPID PANEL
Chol/HDL Ratio: 4.8 ratio (ref 0.0–5.0)
Cholesterol, Total: 237 mg/dL — ABNORMAL HIGH (ref 100–199)
HDL: 49 mg/dL (ref >39)
LDL Chol Calc (NIH): 178 mg/dL — ABNORMAL HIGH (ref 0–99)
Triglycerides: 61 mg/dL (ref 0–149)
VLDL Cholesterol Cal: 10 mg/dL (ref 5–40)

## 2022-08-03 LAB — TSH RFX ON ABNORMAL TO FREE T4: TSH: 0.732 u[IU]/mL (ref 0.450–4.500)

## 2022-08-06 ENCOUNTER — Other Ambulatory Visit (HOSPITAL_COMMUNITY): Payer: Self-pay

## 2022-08-06 ENCOUNTER — Other Ambulatory Visit: Payer: Self-pay | Admitting: Student

## 2022-08-06 DIAGNOSIS — B20 Human immunodeficiency virus [HIV] disease: Secondary | ICD-10-CM

## 2022-08-06 MED ORDER — PRAVASTATIN SODIUM 20 MG PO TABS
20.0000 mg | ORAL_TABLET | Freq: Every day | ORAL | 3 refills | Status: AC
  Filled 2022-08-06 – 2022-08-08 (×2): qty 90, 90d supply, fill #0
  Filled 2022-11-13: qty 90, 90d supply, fill #1

## 2022-08-07 ENCOUNTER — Other Ambulatory Visit (HOSPITAL_COMMUNITY): Payer: Self-pay

## 2022-08-08 ENCOUNTER — Other Ambulatory Visit (HOSPITAL_COMMUNITY): Payer: Self-pay

## 2022-08-08 ENCOUNTER — Other Ambulatory Visit: Payer: Self-pay

## 2022-08-09 ENCOUNTER — Other Ambulatory Visit (HOSPITAL_COMMUNITY): Payer: Self-pay

## 2022-08-09 ENCOUNTER — Other Ambulatory Visit: Payer: Self-pay

## 2022-08-15 ENCOUNTER — Ambulatory Visit (INDEPENDENT_AMBULATORY_CARE_PROVIDER_SITE_OTHER): Payer: 59 | Admitting: Student

## 2022-08-15 ENCOUNTER — Encounter: Payer: Self-pay | Admitting: Student

## 2022-08-15 ENCOUNTER — Other Ambulatory Visit: Payer: Self-pay

## 2022-08-15 VITALS — BP 161/102 | HR 79 | Ht 66.0 in | Wt 189.2 lb

## 2022-08-15 DIAGNOSIS — Z6830 Body mass index (BMI) 30.0-30.9, adult: Secondary | ICD-10-CM

## 2022-08-15 DIAGNOSIS — I10 Essential (primary) hypertension: Secondary | ICD-10-CM

## 2022-08-15 MED ORDER — WEGOVY 0.25 MG/0.5ML ~~LOC~~ SOAJ
0.2500 mg | SUBCUTANEOUS | 1 refills | Status: AC
  Filled 2022-08-15: qty 2, 28d supply, fill #0

## 2022-08-15 MED ORDER — LOSARTAN POTASSIUM 25 MG PO TABS
25.0000 mg | ORAL_TABLET | Freq: Every day | ORAL | 2 refills | Status: DC
  Filled 2022-08-15: qty 30, 30d supply, fill #0
  Filled 2022-09-20: qty 30, 30d supply, fill #1
  Filled 2022-10-17: qty 30, 30d supply, fill #2

## 2022-08-15 NOTE — Assessment & Plan Note (Addendum)
BP: (!) 161/102 today. Poorly controlled. Goal of <130/80. Continue to work on healthy dietary habits and exercise. Follow up in 1 mth.   Medication regimen: Metoprolol 50 mg, Add Losartan 25 mg (Recheck Cr with BMET) Check BP over next week and send me results.  UACr ordered

## 2022-08-15 NOTE — Assessment & Plan Note (Signed)
Meets criteria for GLP1 therapy with BMI >30. Nutritionist information with Dr. Gerilyn Pilgrim given to patient and referral placed. Gave SMART goals on AVS for the next month. Wegovy lowest dose ordered (no h/o pancreatitis or MEN disorders).

## 2022-08-15 NOTE — Patient Instructions (Addendum)
It was great to see you today! Thank you for choosing [facility] for your primary care. Joshua Martin was seen for follow up.  Continue with the Wegovy at the lowest dose once a week  ALSO -Incorporate 1 fruit and 1 veggie into your diet daily  -lower to one sweet tea a day and drink solely water otherwise   We will have you see our nutritionist, she will call and contact you   Losartan 25 mg sent, check BP for the next week and send me your results   If you haven't already, sign up for My Chart to have easy access to your labs results, and communication with your primary care physician.  I recommend that you always bring your medications to each appointment as this makes it easy to ensure you are on the correct medications and helps [name] not miss refills when you need them. Call the clinic at [phone] if your symptoms worsen or you have any concerns.  You should return to our clinic Return in about 4 weeks (around 09/12/2022). Please arrive 15 minutes before your appointment to ensure smooth check in process.  We appreciate your efforts in making this happen.  Thank you for allowing me to participate in your care, Alfredo Martinez, MD 08/15/2022, 9:48 AM PGY-2, [facility]

## 2022-08-15 NOTE — Progress Notes (Signed)
    SUBJECTIVE:   CHIEF COMPLAINT / HPI:    Hypertension: BP: (!) 161/102 today. Home medications include: Metoprolol 50 mg. He endorses taking these medications as prescribed. Does check blood pressure at home. Diet is poor--see below. Exercise: none. Most recent creatinine trend:  Lab Results  Component Value Date   CREATININE 1.31 (H) 02/28/2022   CREATININE 1.23 02/15/2022   Patient has had a BMP in the past 1 year.   Nutrition:  He will eat chicken tenders, and burgers, eat subs at deli in [facility] while he is at work.  When he eats fruits, he still feels very hungry and is not fulfilled with them.  He likes to eat fresh fruit and fresh veggies usually.  He gets off of work, and generally, he eats in the [employer] for dinner.  He will eat snacks, cookies, chips and ice cream.  He will drink sweet tea and juice throughout the day.   He has gained 150>189 lbs  This is the most he has ever weighed.      PERTINENT  PMH / PSH: HIV on [place], anxious mood, HLD, HTN  OBJECTIVE:   BP (!) 161/102   Pulse 79   Ht 5\' 6"  (1.676 m)   Wt 189 lb 3.2 oz (85.8 kg)   SpO2 100%   BMI 30.54 kg/m   General: NAD, pleasant, able to participate in exam Card: rrr, no m/g/r Respiratory: No respiratory distress Skin: warm and dry, no rashes noted Psych: Normal affect and mood   ASSESSMENT/PLAN:   Hypertension BP: (!) 161/102 today. Poorly controlled. Goal of <130/80. Continue to work on healthy dietary habits and exercise. Follow up in 1 mth.   Medication regimen: Metoprolol 50 mg, Add Losartan 25 mg (Recheck Cr with BMET) Check BP over next week and send me results.  UACr ordered    BMI 30.0-30.9,adult Meets criteria for GLP1 therapy with BMI >30. Nutritionist information with Dr. 03-04-1997 given to patient and referral placed. Gave SMART goals on AVS for the next month. Wegovy lowest dose ordered (no h/o pancreatitis or MEN disorders).    HLD: On pravastatin.    Gerilyn Pilgrim, MD [facility]

## 2022-08-16 ENCOUNTER — Other Ambulatory Visit: Payer: Self-pay

## 2022-08-16 ENCOUNTER — Other Ambulatory Visit: Payer: 59

## 2022-08-16 ENCOUNTER — Telehealth: Payer: Self-pay | Admitting: Student

## 2022-08-16 LAB — MICROALBUMIN / CREATININE URINE RATIO
Creatinine, Urine: 122.6 mg/dL
Microalb/Creat Ratio: 6 mg/g creat (ref 0–29)
Microalbumin, Urine: 7.1 ug/mL

## 2022-08-16 NOTE — Telephone Encounter (Signed)
Patient stated he needs prior authorization for his injections for weight loss.

## 2022-08-17 LAB — BASIC METABOLIC PANEL
BUN/Creatinine Ratio: 10 (ref 9–20)
BUN: 13 mg/dL (ref 6–24)
CO2: 23 mmol/L (ref 20–29)
Calcium: 9.4 mg/dL (ref 8.7–10.2)
Chloride: 105 mmol/L (ref 96–106)
Creatinine, Ser: 1.31 mg/dL — ABNORMAL HIGH (ref 0.76–1.27)
Glucose: 90 mg/dL (ref 70–99)
Potassium: 4.3 mmol/L (ref 3.5–5.2)
Sodium: 144 mmol/L (ref 134–144)
eGFR: 69 mL/min/{1.73_m2} (ref >59)

## 2022-08-18 ENCOUNTER — Other Ambulatory Visit (HOSPITAL_COMMUNITY): Payer: Self-pay

## 2022-08-18 ENCOUNTER — Other Ambulatory Visit: Payer: Self-pay

## 2022-08-18 NOTE — Telephone Encounter (Signed)
Will forward to [place] to initiate PA.

## 2022-08-21 ENCOUNTER — Other Ambulatory Visit (HOSPITAL_COMMUNITY): Payer: Self-pay

## 2022-08-21 NOTE — Telephone Encounter (Signed)
A Prior Authorization was initiated for this patients WEGOVY through CoverMyMeds.   Key: [id number]

## 2022-08-23 ENCOUNTER — Encounter: Payer: Self-pay | Admitting: Student

## 2022-08-24 ENCOUNTER — Other Ambulatory Visit: Payer: Self-pay

## 2022-08-24 NOTE — Telephone Encounter (Signed)
Prior Auth for patients medication WEGOVY denied by [facility] via CoverMyMeds.   Reason: When used for weight loss or weight management, our guideline named ANTI-OBESITY AGENTS (reviewed for [facility]) requires that you are actively enrolled in an exercise and caloric reduction program or a weight loss/behavioral modification program.Your provider told us that you were not actively enrolled in an exercise and caloric reduction program or a weight loss/behavioral modification program. This is why your request is denied.  CoverMyMeds Key: [id number]  An appeal is available, however he will need to be enrolled in some form of exercise or caloric reduction program, and documentation will need to be submitted. This is required for most weight loss meds on [employer].

## 2022-08-25 ENCOUNTER — Other Ambulatory Visit: Payer: Self-pay

## 2022-08-25 NOTE — Telephone Encounter (Signed)
Addition Information to previous documented visit.  Patient in nutritional program with Dr. Gerilyn Pilgrim now and working on improving exercise regimen in addition to GLP1 for goal of weight loss. Will continue with frequent visits to assess progress.

## 2022-08-29 NOTE — Telephone Encounter (Signed)
Appeal request submitted/faxed to [employer]

## 2022-08-30 NOTE — Telephone Encounter (Signed)
Appeal request rec'd 08/29/22  Level I appeal request.  Ref [phone]

## 2022-08-31 NOTE — Telephone Encounter (Addendum)
Prior Auth APPEAL for patients medication WEGOVY denied by [facility] via CoverMyMeds.   Reason: When used for weight loss or weight management, our guideline named ANTI-OBESITY AGENTS (reviewed for [facility]) requires that you are actively enrolled in an exercise and caloric reduction program or a weight loss/behavioral modification program. Your provider told us that you were not actively enrolled in an exercise and caloric reduction program or a weight loss/behavioral modification program. This is why your request is denied.  Denial is still the same. Patient will need to be enrolled in a program. May be able to try again once seen by nutritionist.

## 2022-09-01 ENCOUNTER — Encounter: Payer: Self-pay | Admitting: Student

## 2022-09-01 ENCOUNTER — Other Ambulatory Visit (HOSPITAL_COMMUNITY): Payer: Self-pay

## 2022-09-05 ENCOUNTER — Other Ambulatory Visit: Payer: Self-pay

## 2022-09-05 ENCOUNTER — Other Ambulatory Visit (HOSPITAL_COMMUNITY): Payer: Self-pay

## 2022-09-06 ENCOUNTER — Telehealth: Payer: Self-pay

## 2022-09-06 NOTE — Telephone Encounter (Signed)
[facility] Patient Advocate Encounter  Patient's medications have been couriered to [facility] from [place]: [phone] , and will be administered on  09/11/2022.

## 2022-09-07 ENCOUNTER — Emergency Department (HOSPITAL_COMMUNITY): Payer: Commercial Managed Care - PPO

## 2022-09-07 ENCOUNTER — Other Ambulatory Visit: Payer: Self-pay

## 2022-09-07 ENCOUNTER — Emergency Department (HOSPITAL_COMMUNITY)
Admission: EM | Admit: 2022-09-07 | Discharge: 2022-09-07 | Disposition: A | Discharge status: Home / Self Care | Payer: Commercial Managed Care - PPO | Attending: Emergency Medicine | Admitting: Emergency Medicine

## 2022-09-07 ENCOUNTER — Ambulatory Visit: Payer: 59 | Admitting: Internal Medicine

## 2022-09-07 DIAGNOSIS — S8391XA Sprain of unspecified site of right knee, initial encounter: Secondary | ICD-10-CM

## 2022-09-07 DIAGNOSIS — W010XXA Fall on same level from slipping, tripping and stumbling without subsequent striking against object, initial encounter: Secondary | ICD-10-CM | POA: Diagnosis not present

## 2022-09-07 DIAGNOSIS — Z79899 Other long term (current) drug therapy: Secondary | ICD-10-CM | POA: Insufficient documentation

## 2022-09-07 DIAGNOSIS — I10 Essential (primary) hypertension: Secondary | ICD-10-CM | POA: Diagnosis not present

## 2022-09-07 DIAGNOSIS — Z043 Encounter for examination and observation following other accident: Secondary | ICD-10-CM | POA: Diagnosis not present

## 2022-09-07 DIAGNOSIS — M25561 Pain in right knee: Secondary | ICD-10-CM | POA: Diagnosis not present

## 2022-09-07 DIAGNOSIS — S8991XA Unspecified injury of right lower leg, initial encounter: Secondary | ICD-10-CM | POA: Diagnosis present

## 2022-09-07 MED ORDER — ACETAMINOPHEN 500 MG PO TABS
1000.0000 mg | ORAL_TABLET | Freq: Once | ORAL | Status: AC
  Administered 2022-09-07: 1000 mg via ORAL
  Filled 2022-09-07: qty 2

## 2022-09-07 MED ORDER — ACETAMINOPHEN 325 MG PO TABS: 650.0000 mg | ORAL_TABLET | Freq: Once | ORAL | Status: DC

## 2022-09-07 NOTE — ED Triage Notes (Signed)
Pt reports about 30 minutes ago he slipped and fell onto his right leg. C/o pain in the right knee and right thigh.

## 2022-09-07 NOTE — ED Provider Notes (Signed)
[facility] DEPT Provider Note   CSN: [id number] Arrival date & time: 09/07/22  2110     History  Chief Complaint  Patient presents with   Knee Pain    Ehren Berisha is a 42 y.o. male with PMH HTN who presents to ED after slipping and falling outside when leaving work and injuring his right knee.  He c/o right lateral knee and thigh pain. He denies previous injury to his right leg. States he has been unable to fully weight bear since fall due to increase in pain with weight bearing and walking. Denies hitting his head or other complaints/injuries.      Home Medications Prior to Admission medications   Medication Sig Start Date End Date Taking? Authorizing Provider  ammonium lactate (AMLACTIN) 12 % cream APPLY TO THE AFFECTED AREA ON ARMS TWICE DAILY 12/31/20   [provider]  Azelastine HCl 0.15 % SOLN Place into the nose.    [provider]  betamethasone valerate ointment (VALISONE) 0.1 %  02/01/21   [provider]  cabotegravir & rilpivirine ER (CABENUVA) 600 & 900 MG/3ML injection Inject 1 kit into the muscle every 30 (thirty) days. 06/07/22   Mignon Pine, DO  cabotegravir & rilpivirine ER (CABENUVA) 600 & 900 MG/3ML injection Inject 1 kit into the muscle every 2 (two) months. 06/07/22   Mignon Pine, DO  ergocalciferol (VITAMIN D2) 1.25 MG (50000 UT) capsule  12/06/20   [provider]  fluticasone Asencion Islam) 50 MCG/ACT nasal spray  12/17/19   [provider]  hydrOXYzine (ATARAX) 10 MG tablet Take 1 tablet (10 mg total) by mouth 3 (three) times daily as needed for agitation. 08/02/22   Erskine Emery, MD  ketoconazole (NIZORAL) 2 % cream APPLY TO THE AFFECTED AREA ON THE CHEST TWICE DAILY 12/31/20   [provider]  losartan (COZAAR) 25 MG tablet Take 1 tablet (25 mg total) by mouth daily. 08/15/22   Erskine Emery, MD  metoprolol succinate (TOPROL-XL) 50 MG 24 hr tablet Take 1 tablet (50  mg total) by mouth daily. Take with or immediately following a meal. 02/16/22   Autry-Lott, Naaman Plummer, DO  naproxen (NAPROSYN) 500 MG tablet Take 1 tablet (500 mg total) by mouth 2 (two) times daily with a meal. 02/16/22   Martyn Malay, MD  Olopatadine HCl 0.2 % SOLN ceived the following from [facility]: Outside name: olopatadine (PATADAY) 0.2 % drop ophthalmic solution 11/10/20   [provider]  pravastatin (PRAVACHOL) 20 MG tablet Take 1 tablet (20 mg total) by mouth at bedtime. 08/06/22   Erskine Emery, MD  Semaglutide-Weight Management (WEGOVY) 0.25 MG/0.5ML SOAJ Inject 0.25 mg into the skin once a week. 08/15/22   Erskine Emery, MD      Allergies    Emtricitabine-rilpivirine-tenofovir af    Review of Systems   Review of Systems  Constitutional:  Negative for chills and fever.  HENT:  Negative for ear pain and sore throat.   Eyes:  Negative for pain and visual disturbance.  Respiratory:  Negative for cough and shortness of breath.   Cardiovascular:  Negative for chest pain and palpitations.  Gastrointestinal:  Negative for abdominal pain and vomiting.  Genitourinary:  Negative for dysuria and hematuria.  Musculoskeletal:  Negative for back pain.       Right thigh and knee pain  Skin:  Negative for color change and rash.  Neurological:  Negative for seizures and syncope.  All other systems  reviewed and are negative.   Physical Exam Updated Vital Signs BP (!) 169/109   Pulse (!) 103   Temp 98.3 F (36.8 C) (Oral)   Resp 18   SpO2 99%  Physical Exam Vitals and nursing note reviewed.  Constitutional:      General: He is not in acute distress.    Appearance: He is well-developed.  HENT:     Head: Normocephalic and atraumatic.  Eyes:     Conjunctiva/sclera: Conjunctivae normal.  Cardiovascular:     Rate and Rhythm: Normal rate and regular rhythm.     Heart sounds: No murmur heard. Pulmonary:     Effort: Pulmonary effort is normal. No respiratory  distress.     Breath sounds: Normal breath sounds.  Abdominal:     General: Abdomen is flat.     Palpations: Abdomen is soft.     Tenderness: There is no abdominal tenderness.  Musculoskeletal:     Cervical back: Normal range of motion and neck supple.     Right lower leg: No edema.     Left lower leg: No edema.     Comments: Moderate right lateral and posterior knee tenderness, mild tenderness to anterior femur, right knee flexion slightly limited secondary to pain, no patellar tenderness, no palpable deformity or significant swelling to knee or thigh, no tenderness over the right hip, sensation intact, good pulses distally  Skin:    General: Skin is warm and dry.     Capillary Refill: Capillary refill takes less than 2 seconds.  Neurological:     Mental Status: He is alert.  Psychiatric:        Mood and Affect: Mood normal.     ED Results / Procedures / Treatments   Labs (all labs ordered are listed, but only abnormal results are displayed) Labs Reviewed - No data to display  EKG None  Radiology DG FEMUR, MIN 2 VIEWS RIGHT  Result Date: 09/07/2022 CLINICAL DATA:  [id number] Fall [id number] EXAM: RIGHT FEMUR 2 VIEWS COMPARISON:  X-ray right knee 09/07/2022 FINDINGS: There is no evidence of fracture or other focal bone lesions. Soft tissues are unremarkable. The distal lateral thigh noted on the right knee exam. IMPRESSION: No acute displaced fracture or dislocation. Electronically Signed   By: Iven Finn M.D.   On: 09/07/2022 22:03   DG Knee 2 Views Right  Result Date: 09/07/2022 CLINICAL DATA:  Recent slip and fall with right knee pain, initial encounter EXAM: RIGHT KNEE - 2 VIEW COMPARISON:  None Available. FINDINGS: No evidence of fracture, dislocation, or joint effusion. No evidence of arthropathy or other focal bone abnormality. Soft tissues are unremarkable. IMPRESSION: No acute abnormality noted. Electronically Signed   By: Inez Catalina M.D.   On: 09/07/2022 22:02     Procedures Crutches provided to assist with ambulation  Medications Ordered in ED Medications  acetaminophen (TYLENOL) tablet 1,000 mg (1,000 mg Oral Given 09/07/22 2221)    ED Course/ Medical Decision Making/ A&P                           Medical Decision Making Amount and/or Complexity of Data Reviewed Radiology: ordered. Decision-making details documented in ED Course.  Risk OTC drugs.   42 year old male presented to ED post mechanical fall for acute right knee and thigh pain. No bony abnormality on x-ray of right knee and femur. On exam, pt has slightly limited flexion secondary to pain and increased pain  with weight bearing so discharged plan to discharge home with over the counter medication to control pain and importance of primary care follow up should symptoms persist. Aware if symptoms continue he may need further imaging in an outpatient setting and/or referral to ortho for specialist evaluation. Pt agreeable with this plan. Discussed conservative management of knee sprain including rest, elevation, and Tylenol/ibuprofen to help with pain and inflammation. With history of slightly increased kidney function, pt aware he should limit use of ibuprofen and preferably use Tylenol to control his pain but may use ibuprofen in moderation. Pt expressed understanding of plan and aware of return precautions. All questions answered.          Final Clinical Impression(s) / ED Diagnoses Final diagnoses:  Sprain of right knee, unspecified ligament, initial encounter    Rx / DC Orders ED Discharge Orders          Ordered    Crutches       Comments: To help with weight bearing if needed   09/07/22 2219              Suzzette Righter, PA-C 09/09/22 0021    Scottie, Metayer K, DO 09/14/22 1456

## 2022-09-07 NOTE — Discharge Instructions (Signed)
Thank you for letting us take care of you today.  As discussed, there are no broken bones on your x-rays today. Please continue to manage symptoms at home by resting, elevating, and applying ice or heat to knee to help with pain and inflammation. You may take Tylenol or ibuprofen over the counter to help manage your pain.  Should symptoms not start improving in one week, please follow-up with your primary care provider for re-evaluation. You may need more imaging of the knee or to be referred to physical therapy.

## 2022-09-11 ENCOUNTER — Other Ambulatory Visit (HOSPITAL_COMMUNITY): Payer: Self-pay

## 2022-09-11 ENCOUNTER — Other Ambulatory Visit: Payer: Self-pay

## 2022-09-11 ENCOUNTER — Ambulatory Visit (INDEPENDENT_AMBULATORY_CARE_PROVIDER_SITE_OTHER): Payer: Commercial Managed Care - PPO | Admitting: Internal Medicine

## 2022-09-11 ENCOUNTER — Telehealth: Payer: Self-pay

## 2022-09-11 ENCOUNTER — Encounter: Payer: Self-pay | Admitting: Internal Medicine

## 2022-09-11 VITALS — BP 153/105 | HR 90 | Temp 97.9°F | Ht 66.0 in | Wt 196.0 lb

## 2022-09-11 DIAGNOSIS — I10 Essential (primary) hypertension: Secondary | ICD-10-CM

## 2022-09-11 DIAGNOSIS — Z113 Encounter for screening for infections with a predominantly sexual mode of transmission: Secondary | ICD-10-CM

## 2022-09-11 DIAGNOSIS — E785 Hyperlipidemia, unspecified: Secondary | ICD-10-CM

## 2022-09-11 DIAGNOSIS — Z23 Encounter for immunization: Secondary | ICD-10-CM | POA: Diagnosis not present

## 2022-09-11 DIAGNOSIS — Z7185 Encounter for immunization safety counseling: Secondary | ICD-10-CM

## 2022-09-11 DIAGNOSIS — B20 Human immunodeficiency virus [HIV] disease: Secondary | ICD-10-CM

## 2022-09-11 MED ORDER — CABOTEGRAVIR & RILPIVIRINE ER 600 & 900 MG/3ML IM SUER
1.0000 | Freq: Once | INTRAMUSCULAR | Status: AC
  Administered 2022-09-11: 1 via INTRAMUSCULAR

## 2022-09-11 NOTE — Progress Notes (Signed)
[facility]   CHIEF COMPLAINT    HIV follow up.    SUBJECTIVE:    Joshua Martin is a 42 y.o. male with PMHx as below who presents to the clinic for HIV follow up.   Please see A&P for the details of today's visit and status of the patient's medical problems.   Patient's Medications  New Prescriptions   No medications on file  Previous Medications   AMMONIUM LACTATE (AMLACTIN) 12 % CREAM    APPLY TO THE AFFECTED AREA ON ARMS TWICE DAILY   AZELASTINE HCL 0.15 % SOLN    Place into the nose.   BETAMETHASONE VALERATE OINTMENT (VALISONE) 0.1 %       CABOTEGRAVIR & RILPIVIRINE ER (CABENUVA) 600 & 900 MG/3ML INJECTION    Inject 1 kit into the muscle every 30 (thirty) days.   CABOTEGRAVIR & RILPIVIRINE ER (CABENUVA) 600 & 900 MG/3ML INJECTION    Inject 1 kit into the muscle every 2 (two) months.   ERGOCALCIFEROL (VITAMIN D2) 1.25 MG (50000 UT) CAPSULE       FLUTICASONE (FLONASE) 50 MCG/ACT NASAL SPRAY       HYDROXYZINE (ATARAX) 10 MG TABLET    Take 1 tablet (10 mg total) by mouth 3 (three) times daily as needed for agitation.   KETOCONAZOLE (NIZORAL) 2 % CREAM    APPLY TO THE AFFECTED AREA ON THE CHEST TWICE DAILY   LOSARTAN (COZAAR) 25 MG TABLET    Take 1 tablet (25 mg total) by mouth daily.   METOPROLOL SUCCINATE (TOPROL-XL) 50 MG 24 HR TABLET    Take 1 tablet (50 mg total) by mouth daily. Take with or immediately following a meal.   NAPROXEN (NAPROSYN) 500 MG TABLET    Take 1 tablet (500 mg total) by mouth 2 (two) times daily with a meal.   OLOPATADINE HCL 0.2 % SOLN    ceived the following from [facility]: Outside name: olopatadine (PATADAY) 0.2 % drop ophthalmic solution   PRAVASTATIN (PRAVACHOL) 20 MG TABLET    Take 1 tablet (20 mg total) by mouth at bedtime.   SEMAGLUTIDE-WEIGHT MANAGEMENT (WEGOVY) 0.25 MG/0.5ML SOAJ    Inject 0.25 mg into the skin once a week.  Modified Medications   No medications on file  Discontinued  Medications   No medications on file      Past Medical History:  Diagnosis Date   Chest pain    Hypertension     Social History   Tobacco Use   Smoking status: Never   Smokeless tobacco: Never  Substance Use Topics   Alcohol use: Yes    Comment: once or twice per week   Drug use: Yes    Types: Marijuana    Comment: "once in a blue moon"    Family History  Problem Relation Age of Onset   Coronary artery disease Father    CAD Paternal Grandmother     Allergies  Allergen Reactions   Emtricitabine-Rilpivirine-Tenofovir Af     Other reaction(s): Other (See Comments), other/intolerance Reaction Type: Allergy; Severity: Mild; Reaction(s): Urticaria. Rash Reaction Type: Allergy; Severity: Mild; Reaction(s): Urticaria. Rash Reaction Type: Allergy; Severity: Mild; Reaction(s): Urticaria. Rash Reaction Type: Allergy; Severity: Mild; Reaction(s): Urticaria. Rash     Review of Systems  Constitutional: Negative.   Musculoskeletal:  Positive for joint pain.     OBJECTIVE:    Vitals:   09/11/22 1046  BP: (!) 153/105  Pulse:  90  Temp: 97.9 F (36.6 C)  TempSrc: Temporal  SpO2: 98%  Weight: 196 lb (88.9 kg)  Height: 5\' 6"  (1.676 m)     Body mass index is 31.64 kg/m.  Physical Exam Constitutional:      Appearance: Normal appearance.  Eyes:     Extraocular Movements: Extraocular movements intact.     Conjunctiva/sclera: Conjunctivae normal.  Abdominal:     General: There is no distension.     Palpations: Abdomen is soft.  Musculoskeletal:     Comments: Ambulating with a cane and right knee in brace.  Skin:    General: Skin is warm and dry.  Neurological:     General: No focal deficit present.     Mental Status: He is alert and oriented to person, place, and time.  Psychiatric:        Mood and Affect: Mood normal.        Behavior: Behavior normal.     Labs and Microbiology:    Latest Ref Rng & Units 08/16/2022   12:40 PM 02/28/2022    2:23 AM  02/15/2022    3:43 PM  CMP  Glucose 70 - 99 mg/dL 90  71  79   BUN 6 - 24 mg/dL 13  17  16    Creatinine 0.76 - 1.27 mg/dL 1.31  1.31  1.23   Sodium 134 - 144 mmol/L 144  141  140   Potassium 3.5 - 5.2 mmol/L 4.3  4.0  4.0   Chloride 96 - 106 mmol/L 105  107  107   CO2 20 - 29 mmol/L 23  26  23    Calcium 8.7 - 10.2 mg/dL 9.4  9.3  9.7   Total Protein 6.1 - 8.1 g/dL  6.7  7.1   Total Bilirubin 0.2 - 1.2 mg/dL  0.4  0.7   Alkaline Phos 38 - 126 U/L   45   AST 10 - 40 U/L  14  14   ALT 9 - 46 U/L  11  14       Latest Ref Rng & Units 02/28/2022    2:23 AM 02/15/2022    3:43 PM  CBC  WBC 3.8 - 10.8 Thousand/uL 7.1  7.0   Hemoglobin 13.2 - 17.1 g/dL 12.2  13.4   Hematocrit 38.5 - 50.0 % 36.1  41.7   Platelets 140 - 400 Thousand/uL 264  296      Lab Results  Component Value Date   HIV1RNAQUANT Not Detected 07/05/2022   HIV1RNAQUANT Not Detected 06/07/2022   HIV1RNAQUANT NOT DETECTED 02/28/2022   HIV1RNAVL <20 02/16/2022   CD4TABS 693 02/28/2022    RPR and STI: Lab Results  Component Value Date   LABRPR NON-REACTIVE 07/05/2022   LABRPR NON-REACTIVE 02/28/2022    STI Results GC CT  03/17/2022  2:25 PM Negative    Negative  Negative    Negative   02/28/2022  2:34 PM Negative  Negative     Hepatitis B: Lab Results  Component Value Date   HEPBSAB NON-REACTIVE 02/28/2022   HEPBSAG NON-REACTIVE 02/28/2022   HEPBCAB NON-REACTIVE 02/28/2022   Hepatitis C: Lab Results  Component Value Date   HEPCAB NON-REACTIVE 02/28/2022   Hepatitis A: Lab Results  Component Value Date   HAV REACTIVE (A) 02/28/2022   Lipids: Lab Results  Component Value Date   CHOL 237 (H) 08/02/2022   TRIG 61 08/02/2022   HDL 49 08/02/2022   CHOLHDL 4.8 08/02/2022   LDLCALC 178 (H) 08/02/2022  Imaging:    ASSESSMENT & PLAN:    HIV infection (HCC) Patient was started on Cabenuva in October 2023 and received follow up injection in November.  Here today for his q24month injection.  Will  give today and check labs.  Follow up in 2 months with pharmacy and 4 months with myself.  Vaccine counseling Will give 2nd dose of Hepatitis B vaccine today.  Routine screening for STI (sexually transmitted infection) Screening offered today and he declines.    Hyperlipidemia He is currently on Pravastatin 20mg  daily managed by his PCP.    Hypertension BP today is elevated.  Managed by his PCP.   Orders Placed This Encounter  Procedures   HIV-1 RNA quant-no reflex-bld   T-helper cell (CD4)- ([facility] clinic only)        for Infectious Disease [facility] 09/11/2022, 11:09 AM

## 2022-09-11 NOTE — Assessment & Plan Note (Signed)
BP today is elevated.  Managed by his PCP.

## 2022-09-11 NOTE — Assessment & Plan Note (Signed)
Will give 2nd dose of Hepatitis B vaccine today.

## 2022-09-11 NOTE — Telephone Encounter (Signed)
Prior Auth for patients medication MOUNJARO denied by [facility] via CoverMyMeds.   Reason: HEALTH PLAN DOES NOT COVER WEIGHT LOSS MEDICATIONS  CoverMyMeds Key: [id number]

## 2022-09-11 NOTE — Assessment & Plan Note (Signed)
He is currently on Pravastatin 20mg  daily managed by his PCP.

## 2022-09-11 NOTE — Telephone Encounter (Signed)
A Prior Authorization was initiated for this patients WEGOVY through CoverMyMeds.   OXB:[id number]  Daeshaun Specht L. CPhT Rx Patient Advocate

## 2022-09-11 NOTE — Addendum Note (Signed)
Addended by: Adelfa Koh on: 09/11/2022 12:05 PM   Modules accepted: Orders

## 2022-09-11 NOTE — Assessment & Plan Note (Signed)
Patient was started on Cabenuva in October 2023 and received follow up injection in November.  Here today for his q67month injection.  Will give today and check labs.  Follow up in 2 months with pharmacy and 4 months with myself.

## 2022-09-11 NOTE — Assessment & Plan Note (Signed)
Screening offered today and he declines.   

## 2022-09-12 ENCOUNTER — Ambulatory Visit: Payer: Self-pay | Admitting: Student

## 2022-09-13 ENCOUNTER — Ambulatory Visit (INDEPENDENT_AMBULATORY_CARE_PROVIDER_SITE_OTHER): Payer: Commercial Managed Care - PPO | Admitting: Family Medicine

## 2022-09-13 ENCOUNTER — Other Ambulatory Visit: Payer: Self-pay

## 2022-09-13 ENCOUNTER — Encounter: Payer: Self-pay | Admitting: Neurology

## 2022-09-13 ENCOUNTER — Ambulatory Visit (INDEPENDENT_AMBULATORY_CARE_PROVIDER_SITE_OTHER): Payer: Commercial Managed Care - PPO | Admitting: Neurology

## 2022-09-13 VITALS — BP 124/86 | HR 86 | Ht 66.0 in | Wt 188.0 lb

## 2022-09-13 VITALS — BP 153/96 | HR 78 | Ht 66.0 in | Wt 188.0 lb

## 2022-09-13 DIAGNOSIS — E669 Obesity, unspecified: Secondary | ICD-10-CM | POA: Diagnosis not present

## 2022-09-13 DIAGNOSIS — R0683 Snoring: Secondary | ICD-10-CM

## 2022-09-13 DIAGNOSIS — M25561 Pain in right knee: Secondary | ICD-10-CM | POA: Diagnosis not present

## 2022-09-13 DIAGNOSIS — R0681 Apnea, not elsewhere classified: Secondary | ICD-10-CM

## 2022-09-13 DIAGNOSIS — Z9189 Other specified personal risk factors, not elsewhere classified: Secondary | ICD-10-CM

## 2022-09-13 DIAGNOSIS — G4730 Sleep apnea, unspecified: Secondary | ICD-10-CM

## 2022-09-13 DIAGNOSIS — R519 Headache, unspecified: Secondary | ICD-10-CM

## 2022-09-13 DIAGNOSIS — R030 Elevated blood-pressure reading, without diagnosis of hypertension: Secondary | ICD-10-CM | POA: Diagnosis not present

## 2022-09-13 DIAGNOSIS — R351 Nocturia: Secondary | ICD-10-CM | POA: Diagnosis not present

## 2022-09-13 LAB — HELPER T-LYMPH-CD4 (ARMC ONLY)
% CD 4 Pos. Lymph.: 31.3 % (ref 30.8–58.5)
Absolute CD 4 Helper: 313 /uL — ABNORMAL LOW (ref 359–1519)
Basophils Absolute: 0 10*3/uL (ref 0.0–0.2)
Basos: 1 %
EOS (ABSOLUTE): 0.1 10*3/uL (ref 0.0–0.4)
Eos: 2 %
Hematocrit: 39.4 % (ref 37.5–51.0)
Hemoglobin: 12.4 g/dL — ABNORMAL LOW (ref 13.0–17.7)
Immature Grans (Abs): 0 10*3/uL (ref 0.0–0.1)
Immature Granulocytes: 0 %
Lymphocytes Absolute: 1 10*3/uL (ref 0.7–3.1)
Lymphs: 19 %
MCH: 29.5 pg (ref 26.6–33.0)
MCHC: 31.5 g/dL (ref 31.5–35.7)
MCV: 94 fL (ref 79–97)
Monocytes Absolute: 0.4 10*3/uL (ref 0.1–0.9)
Monocytes: 7 %
Neutrophils Absolute: 4 10*3/uL (ref 1.4–7.0)
Neutrophils: 71 %
Platelets: 269 10*3/uL (ref 150–450)
RBC: 4.2 x10E6/uL (ref 4.14–5.80)
RDW: 11.9 % (ref 11.6–15.4)
WBC: 5.6 10*3/uL (ref 3.4–10.8)

## 2022-09-13 LAB — HIV-1 RNA QUANT-NO REFLEX-BLD
HIV 1 RNA Quant: NOT DETECTED Copies/mL
HIV-1 RNA Quant, Log: NOT DETECTED Log cps/mL

## 2022-09-13 MED ORDER — IBUPROFEN 600 MG PO TABS
600.0000 mg | ORAL_TABLET | Freq: Three times a day (TID) | ORAL | 0 refills | Status: DC | PRN
  Filled 2022-09-13: qty 30, 10d supply, fill #0

## 2022-09-13 NOTE — Progress Notes (Signed)
Subjective:    Patient ID: Joshua Martin is a 42 y.o. male.  HPI    Joshua Age, MD, PhD [facility] [address] [place], [place] [address]  Dear Drs. Zigmund Daniel and [employer],   I saw your patient, Joshua Martin, upon your kind request in my sleep clinic today for initial consultation of his sleep disorder, in particular, concern for underlying obstructive sleep apnea.  The patient is unaccompanied today.  As you know, [name] is a 42 year old male with an underlying medical history of hypertension, hyperlipidemia, anxiety, and mild obesity, who reports snoring and sleep disruption, waking up with a sense of choking.  He has been told by his partner that he has pauses in his breathing.  He occasionally wakes up with a headache.  He has nocturia once or twice per average night.  His Epworth sleepiness score is 6 out of 24, fatigue severity score is 13 out of 63.. I reviewed your office note from 08/02/2022.  He lives with his partner, no children, 1 dog in the household, the dog does not sleep in the bedroom with them.  They do have a TV on in the bedroom and sometimes it stays on all night.  Bedtime and rise time are variable but generally he is in bed before 11:30 PM.  Rise time is somewhere between 6 or 7 on most mornings or as late as 9 AM.  He works in [employer] at [employer].  He sprained his right knee recently and is walking with a walking stick and is situated in a clinic wheelchair today.  He is a non-smoker and drinks alcohol occasionally, caffeine in the form of tea, generally 1/day.  He initially denied any tonsillectomy but called his mom after my examination as I did not see his tonsils and she confirmed that he had a tonsillectomy as a child.  He is not aware of any family history of sleep apnea.  His Past Medical History Is Significant For: Past Medical History:  Diagnosis Date   Chest pain    Hypertension      His Past Surgical History Is Significant For: History reviewed. No pertinent surgical history.  His Family History Is Significant For: Family History  Problem Relation Martin of Onset   Coronary artery disease Father    CAD Paternal Grandmother    Sleep apnea Neg Hx     His Social History Is Significant For: Social History   Socioeconomic History   Marital status: Single    Spouse name: Not on file   Number of children: Not on file   Years of education: Not on file   Highest education level: Not on file  Occupational History   Not on file  Tobacco Use   Smoking status: Never   Smokeless tobacco: Never  Substance and Sexual Activity   Alcohol use: Yes    Comment: occ   Drug use: Yes    Types: Marijuana    Comment: "once in a blue moon"   Sexual activity: Not on file    Comment: declined condoms  Other Topics Concern   Not on file  Social History Narrative   Not on file   Social Determinants of Health   Financial Resource Strain: Not on file  Food Insecurity: Not on file  Transportation Needs: Not on file  Physical Activity: Not on file  Stress: Not on file  Social Connections: Not on file    His Allergies  Are:  Allergies  Allergen Reactions   Emtricitabine-Rilpivirine-Tenofovir Af     Other reaction(s): Other (See Comments), other/intolerance Reaction Type: Allergy; Severity: Mild; Reaction(s): Urticaria. Rash Reaction Type: Allergy; Severity: Mild; Reaction(s): Urticaria. Rash Reaction Type: Allergy; Severity: Mild; Reaction(s): Urticaria. Rash Reaction Type: Allergy; Severity: Mild; Reaction(s): Urticaria. Rash   :   His Current Medications Are:  Outpatient Encounter Medications as of 09/13/2022  Medication Sig   ammonium lactate (AMLACTIN) 12 % cream as needed.   Azelastine HCl 0.15 % SOLN Place into the nose as needed.   betamethasone valerate ointment (VALISONE) 0.1 % Apply topically as needed.   cabotegravir & rilpivirine ER (CABENUVA) 600 &  900 MG/3ML injection Inject 1 kit into the muscle every 30 (thirty) days.   cabotegravir & rilpivirine ER (CABENUVA) 600 & 900 MG/3ML injection Inject 1 kit into the muscle every 2 (two) months.   fluticasone (FLONASE) 50 MCG/ACT nasal spray Place into both nostrils as needed.   hydrOXYzine (ATARAX) 10 MG tablet Take 1 tablet (10 mg total) by mouth 3 (three) times daily as needed for agitation.   losartan (COZAAR) 25 MG tablet Take 1 tablet (25 mg total) by mouth daily.   metoprolol succinate (TOPROL-XL) 50 MG 24 hr tablet Take 1 tablet (50 mg total) by mouth daily. Take with or immediately following a meal.   naproxen (NAPROSYN) 500 MG tablet Take 1 tablet (500 mg total) by mouth 2 (two) times daily with a meal. (Patient taking differently: Take 500 mg by mouth daily at 2 PM.)   pravastatin (PRAVACHOL) 20 MG tablet Take 1 tablet (20 mg total) by mouth at bedtime.   ergocalciferol (VITAMIN D2) 1.25 MG (50000 UT) capsule  (Patient not taking: Reported on 09/11/2022)   ketoconazole (NIZORAL) 2 % cream APPLY TO THE AFFECTED AREA ON THE CHEST TWICE DAILY (Patient not taking: Reported on 09/11/2022)   Olopatadine HCl 0.2 % SOLN ceived the following from [facility]: Outside name: olopatadine (PATADAY) 0.2 % drop ophthalmic solution (Patient not taking: Reported on 09/11/2022)   Semaglutide-Weight Management (WEGOVY) 0.25 MG/0.5ML SOAJ Inject 0.25 mg into the skin once a week. (Patient not taking: Reported on 09/11/2022)   No facility-administered encounter medications on file as of 09/13/2022.  :   Review of Systems:  Out of a complete 14 point review of systems, all are reviewed and negative with the exception of these symptoms as listed below:  Review of Systems  Neurological:        Pt here for sleep consult   Pt snores, headaches,hypertension Pt denies fatigue,sleep study,CPAP machine     ESS:6 FSS:13    Objective:  Neurological Exam  Physical Exam Physical Examination:    Vitals:   09/13/22 0907  BP: (!) 153/96  Pulse: 78    General Examination: The patient is a very pleasant 42 y.o. male in no acute distress. He appears well-developed and well-nourished and well groomed.  He is situated in a transport chair.  HEENT: Normocephalic, atraumatic, pupils are equal, round and reactive to light, extraocular tracking is good without limitation to gaze excursion or nystagmus noted. Hearing is grossly intact. Face is symmetric with normal facial animation. Speech is clear with no dysarthria noted. There is no hypophonia. There is no lip, neck/head, jaw or voice tremor. Neck is supple with full range of passive and active motion. There are no carotid bruits on auscultation. Oropharynx exam reveals: mild mouth dryness, adequate dental hygiene and moderate airway crowding, due to  wider tongue, small airway entry, Mallampati class III.  Tonsils absent.  Uvula slightly wider but not enlarged as such.  Neck circumference 16-3/8 inches.  Tongue protrudes centrally and palate elevates symmetrically.  Chest: Clear to auscultation without wheezing, rhonchi or crackles noted.  Heart: S1+S2+0, regular and normal without murmurs, rubs or gallops noted.   Abdomen: Soft, non-tender and non-distended.  Extremities: There is no pitting edema in the distal lower extremities bilaterally.   Skin: Warm and dry without trophic changes noted.   Musculoskeletal: exam reveals no obvious joint deformities.  Soft knee brace in place on the right.  Neurologically:  Mental status: The patient is awake, alert and oriented in all 4 spheres. His immediate and remote memory, attention, language skills and fund of knowledge are appropriate. There is no evidence of aphasia, agnosia, apraxia or anomia. Speech is clear with normal prosody and enunciation. Thought process is linear. Mood is normal and affect is normal.  Cranial nerves II - XII are as described above under HEENT exam.  Motor exam:  Normal bulk, strength and tone is noted. There is no obvious action or resting tremor.  Fine motor skills and coordination: grossly intact.  Cerebellar testing: No dysmetria or intention tremor. There is no truncal or gait ataxia.  Sensory exam: intact to light touch in the upper and lower extremities.  Gait, station and balance: I did not have him stand or walk for me today.   Assessment and Plan:   In summary, Joshua Martin is a very pleasant 42 y.o.-year old male with an underlying medical history of hypertension, hyperlipidemia, anxiety, and mild obesity, whose history and physical exam are concerning for sleep disordered breathing, supporting a current working diagnosis of unspecified sleep apnea, with the main differential diagnoses of obstructive sleep apnea (OSA) versus upper airway resistance syndrome (UARS) versus central sleep apnea (CSA), or mixed sleep apnea. A laboratory attended sleep study is typically considered "gold standard" for evaluation of sleep disordered breathing.   I had a long chat with the patient about my findings and the diagnosis of sleep apnea, particularly OSA, its prognosis and treatment options. We talked about medical/conservative treatments, surgical interventions and non-pharmacological approaches for symptom control. I explained, in particular, the risks and ramifications of untreated moderate to severe OSA, especially with respect to developing cardiovascular disease down the road, including congestive heart failure (CHF), difficult to treat hypertension, cardiac arrhythmias (particularly A-fib), neurovascular complications including TIA, stroke and dementia. Even type 2 diabetes has, in part, been linked to untreated OSA. Symptoms of untreated OSA may include (but may not be limited to) daytime sleepiness, nocturia (i.e. frequent nighttime urination), memory problems, mood irritability and suboptimally controlled or worsening mood disorder such as depression  and/or anxiety, lack of energy, lack of motivation, physical discomfort, as well as recurrent headaches, especially morning or nocturnal headaches. We talked about the importance of maintaining a healthy lifestyle and striving for healthy weight. In addition, we talked about the importance of striving for and maintaining good sleep hygiene. I recommended a sleep study at this time. I outlined the differences between a laboratory attended sleep study which is considered more comprehensive and accurate over the option of a home sleep test (HST); the latter may lead to underestimation of sleep disordered breathing in some instances and does not help with diagnosing upper airway resistance syndrome and is not accurate enough to diagnose primary central sleep apnea typically. I outlined possible surgical and non-surgical treatment options of OSA, including the use  of a positive airway pressure (PAP) device (i.e. CPAP, AutoPAP/APAP or BiPAP in certain circumstances), a custom-made dental device (aka oral appliance, which would require a referral to a specialist dentist or orthodontist typically, and is generally speaking not considered for patients with full dentures or edentulous state), upper airway surgical options, such as traditional UPPP (which is not considered a first-line treatment) or the Inspire device (hypoglossal nerve stimulator, which would involve a referral for consultation with an ENT surgeon, after careful selection, following inclusion criteria - also not first-line treatment). I explained the PAP treatment option to the patient in detail, as this is generally considered first-line treatment.  The patient indicated that he would be willing to try PAP therapy, if the need arises. I explained the importance of being compliant with PAP treatment, not only for insurance purposes but primarily to improve patient's symptoms symptoms, and for the patient's long term health benefit, including to reduce His  cardiovascular risks longer-term.    We will pick up our discussion about the next steps and treatment options after testing.  We will keep him posted as to the test results by phone call and/or MyChart messaging where possible.  We will plan to follow-up in sleep clinic accordingly as well.  I answered all his questions today and the patient was in agreement.   I encouraged him to call with any interim questions, concerns, problems or updates or email [name] through [place].  Generally speaking, sleep test authorizations may take up to 2 weeks, sometimes less, sometimes longer, the patient is encouraged to get in touch with [name] if they do not hear back from the sleep lab staff directly within the next 2 weeks.  Thank you very much for allowing me to participate in the care of this nice patient. If I can be of any further assistance to you please do not hesitate to call me at [phone].  Sincerely,   Joshua Age, MD, PhD

## 2022-09-13 NOTE — Patient Instructions (Signed)

## 2022-09-13 NOTE — Progress Notes (Signed)
    SUBJECTIVE:   CHIEF COMPLAINT / HPI:   [place] [address] and twisted right knee one week ago while moving furniture.  Unclear about exact mechanism of injury.  Immediate pain.  Not immediate swelling.  Seen in ER.  Xrays normal  Told sprained knee  Improving some but still cannot walk on right knee without support.  Asks for additional time off work - needs note or will be fired. Pain distribution is medial right knee extending up to the lower 1/3 of femus  No previous knee problem.  No prior injury or arthritis    OBJECTIVE:   BP 124/86   Pulse 86   Ht 5\' 6"  (1.676 m)   Wt 188 lb (85.3 kg)   SpO2 98%   BMI 30.34 kg/m   No effusion.  Actually, seems to have some early quadricept atrophy.  Pain is greatest at medial joint line.  Ligaments appear intact to testing.  ASSESSMENT/PLAN:   Right knee pain Likely ER was right and this is a minor sprain that will heal on its own.  I am bothered by the amount of pain and his limited weight bearing.  It is possible that he has an internal derangement of knee (unlikely without effusion.)  Refer to sports med.  Work excuse given.  Has been taking occaisional tylenol.  Trial of regular dosed ibuprofen for antiinflammatory effect.     Zenia Resides, MD [place]

## 2022-09-13 NOTE — Assessment & Plan Note (Signed)
Likely ER was right and this is a minor sprain that will heal on its own.  I am bothered by the amount of pain and his limited weight bearing.  It is possible that he has an internal derangement of knee (unlikely without effusion.)  Refer to sports med.  Work excuse given.  Has been taking occaisional tylenol.  Trial of regular dosed ibuprofen for antiinflammatory effect.

## 2022-09-13 NOTE — Patient Instructions (Addendum)
Maybe it will get better on its own.  Given the amount of pain you still have, I am worried that you have done some damage to the ligaments or carteledge.  I have put in a referral to sports med.   I also sent in some pain and antiinflammatory medicine that I want your to take regularly, three times per day for the next four days.  Then you can just take it as needed.   I hope this heals up on its own.

## 2022-09-14 ENCOUNTER — Other Ambulatory Visit: Payer: Self-pay

## 2022-09-19 ENCOUNTER — Other Ambulatory Visit: Payer: Self-pay

## 2022-09-20 ENCOUNTER — Ambulatory Visit (INDEPENDENT_AMBULATORY_CARE_PROVIDER_SITE_OTHER): Payer: Commercial Managed Care - PPO | Admitting: Sports Medicine

## 2022-09-20 ENCOUNTER — Ambulatory Visit: Payer: Self-pay

## 2022-09-20 ENCOUNTER — Other Ambulatory Visit: Payer: Self-pay

## 2022-09-20 ENCOUNTER — Other Ambulatory Visit: Payer: Self-pay | Admitting: Student

## 2022-09-20 VITALS — BP 142/100 | Ht 66.0 in | Wt 188.0 lb

## 2022-09-20 DIAGNOSIS — M25561 Pain in right knee: Secondary | ICD-10-CM

## 2022-09-20 DIAGNOSIS — M79651 Pain in right thigh: Secondary | ICD-10-CM

## 2022-09-20 DIAGNOSIS — F419 Anxiety disorder, unspecified: Secondary | ICD-10-CM

## 2022-09-20 MED ORDER — HYDROXYZINE HCL 10 MG PO TABS
10.0000 mg | ORAL_TABLET | Freq: Three times a day (TID) | ORAL | 0 refills | Status: DC | PRN
  Filled 2022-09-20: qty 90, 30d supply, fill #0

## 2022-09-20 NOTE — Patient Instructions (Addendum)
You have likely suffered a strain of your quad muscles in your thigh.  Continue with compression, ice and as needed ibuprofen.  Use the crutches for ambulation as needed but as you begin to feel better start to wean yourself off of them.  I will see you in 2 weeks in my office for follow-up.

## 2022-09-20 NOTE — Assessment & Plan Note (Addendum)
Right knee pain has resolved but today has thigh pain that is slowly improving but is still quite painful causing him to limp.  I reviewed the x-rays taken in the ER of his knee and thigh.  I agree with the radiologist reported no obvious bony fracture.  No obvious deficits seen on exam today likely has a quadriceps strain.  Recommended compression he may continue with a knee brace or the Ace bandage that we wrapped right is quite today.  He was also given crutches to help with ambulation.  Continue with ice and as needed ibuprofen.  Work note was provided for light duty, no ambulating further than 100 feet with frequent sit breaks.  Will follow-up with him in 2 weeks.  If he has no improvement can consider repeat ultrasound and if nothing found then MRI evaluation.  He verbalized understanding.

## 2022-09-20 NOTE — Progress Notes (Signed)
New Patient Office Visit  Subjective   Patient ID: Joshua Martin, male    DOB: [birth year]  Age: 42 y.o. MRN: [id number]  R thigh pain.   Joshua Martin is here today with chief complaint of right thigh pain after an injury on January 4 where he fell twisting his knee and had something fall on his upper leg.  He was evaluated in the ER with negative x-rays for fracture and was told he had a sprain of his knee.  He followed up with his primary care provider who sent him for referral here as he is still limping quite a bit.  He reports to me today that his knee pain has greatly improved, mainly resolved however his thigh pain is still present.  This is causing him to limp quite a bit.  He was written out of work for the past 2 weeks and was supposed to return today.  He has been taking 600 milligrams of ibuprofen 3 times a day with only temporary relief.  He has been using a knee brace wrapped around his thigh for some compression and support.  He still does a lot of trouble and pain in his thigh when going up and down the stairs and walking for long distances.  Sometimes the pain causes his leg to buckle however, he would like to return to work today.   ROS as listed above in HPI    Objective:     BP (!) 142/100   Ht 5\' 6"  (1.676 m)   Wt 188 lb (85.3 kg)   BMI 30.34 kg/m    Physical Exam Vitals reviewed.  Constitutional:      General: He is not in acute distress.    Appearance: Normal appearance. He is normal weight. He is not ill-appearing, toxic-appearing or diaphoretic.  Pulmonary:     Effort: Pulmonary effort is normal.  Skin:    General: Skin is warm.  Neurological:     Mental Status: He is alert.     Gait: Gait abnormal (Antalgic gait favoring right leg).   Right knee: No obvious deformity or asymmetry.  No ecchymosis edema or effusion.  No tenderness to palpation along medial or lateral joint line slightly decreased range of motion from about 0 to 100 degrees secondary to some  tightness and pain in his thigh.  Stable to varus and valgus stress. Right thigh: No obvious deformity or asymmetry.  No obvious ecchymosis or edema.  Tenderness to palpation at the distal medial and lateral aspects of his thigh. Full range of motion of the right hip with flexion internal and external rotation. Difficulty with ambulation and stepping up to exam table.   Limited ultrasound, right thigh: Quick dynamic scanning was performed over the medial and lateral quadriceps muscles.  No obvious hypoechoic deficit within the muscle fibers in the horizontal or longitudinal axis. Impression: No large quadriceps tear or hematoma seen today or at his point of max tenderness.  Ultrasound and interpretation by Sharyn Lull A. Arlis Porta, DO   Assessment & Plan:   Problem List Items Addressed This Visit       Other   Right knee pain - Primary    Right knee pain has resolved but today has thigh pain that is slowly improving but is still quite painful causing him to limp.  I reviewed the x-rays taken in the ER of his knee and thigh.  I agree with the radiologist reported no obvious bony fracture.  No obvious deficits seen on exam  today likely has a quadriceps strain.  Recommended compression he may continue with a knee brace or the Ace bandage that we wrapped right is quite today.  He was also given crutches to help with ambulation.  Continue with ice and as needed ibuprofen.  Work note was provided for light duty, no ambulating further than 100 feet with frequent sit breaks.  Will follow-up with him in 2 weeks.  If he has no improvement can consider repeat ultrasound and if nothing found then MRI evaluation.  He verbalized understanding.       Return in about 2 weeks (around 10/04/2022).    Elmore Guise, DO  Addendum:  Patient seen in the office by fellow.  Her history, exam, plan of care were precepted with me.  Patient's knee and hip exam reassuring and benign as well as his imaging.  Karlton Lemon MD Kirt Boys

## 2022-09-21 ENCOUNTER — Other Ambulatory Visit: Payer: Self-pay

## 2022-09-21 MED ORDER — MELOXICAM 15 MG PO TABS
15.0000 mg | ORAL_TABLET | Freq: Every day | ORAL | 1 refills | Status: AC | PRN
  Filled 2022-09-21: qty 30, 30d supply, fill #0
  Filled 2022-11-13: qty 30, 30d supply, fill #1

## 2022-09-22 ENCOUNTER — Other Ambulatory Visit: Payer: Self-pay

## 2022-09-22 ENCOUNTER — Encounter: Payer: Self-pay | Admitting: Student

## 2022-09-22 ENCOUNTER — Ambulatory Visit (INDEPENDENT_AMBULATORY_CARE_PROVIDER_SITE_OTHER): Payer: Commercial Managed Care - PPO | Admitting: Student

## 2022-09-22 VITALS — BP 130/78 | HR 84 | Ht 66.0 in | Wt 195.0 lb

## 2022-09-22 DIAGNOSIS — M25561 Pain in right knee: Secondary | ICD-10-CM

## 2022-09-22 NOTE — Progress Notes (Signed)
  SUBJECTIVE:   CHIEF COMPLAINT / HPI:   Right Knee Pain: XR unremarkable after trauma to the knee onset 09/07/22 when he twisted his knee and had something fall on the upper leg. Still limping with use of crutches intermediately in the house. Difficulty walking up and down stairs. He has been out of work during this time as he is having difficulty lifting items.   PERTINENT  PMH / PSH: HTN  OBJECTIVE:  BP 130/78   Pulse 84   Ht 5\' 6"  (1.676 m)   Wt 195 lb (88.5 kg)   BMI 31.47 kg/m  Physical Exam  General: Alert and oriented in no apparent distress Heart: Regular rate and rhythm with no murmurs appreciated Lungs: CTA bilaterally, no wheezing Abdomen: Bowel sounds present, no abdominal pain Skin: Warm and dry  Right knee:  No effusion or ecchymosis  Hesitant with gait  Good ROM  NTTP No ligamentous laxity MCL/PCL intact  No crepitus    ASSESSMENT/PLAN:  Acute pain of right knee Assessment & Plan: Strain in the knee after acute trauma  Gradually improving  Continue with PT rx  May need MRI if not improved in 2 weeks  F/u [facility] FMLA paperwork completed    Orders: -     Ambulatory referral to Physical Therapy   Return in about 4 weeks (around 10/20/2022) for Knee pain . Erskine Emery, MD 09/22/2022, 12:50 PM PGY-2, [place]

## 2022-09-22 NOTE — Patient Instructions (Addendum)
It was great to see you today! Thank you for choosing [facility] for your primary care. Joshua Martin was seen for follow up.  Today we addressed: Continue with physical therapy and working out of the crutches as tolerated  Use over the counter medications for pain relief like Tylenol   If you haven't already, sign up for My Chart to have easy access to your labs results, and communication with your primary care physician.  I recommend that you always bring your medications to each appointment as this makes it easy to ensure you are on the correct medications and helps [name] not miss refills when you need them. Call the clinic at [phone] if your symptoms worsen or you have any concerns.  You should return to our clinic Return in about 4 weeks (around 10/20/2022) for Knee pain . Please arrive 15 minutes before your appointment to ensure smooth check in process.  We appreciate your efforts in making this happen.  Thank you for allowing me to participate in your care, Joshua Emery, MD 09/22/2022, 10:49 AM PGY-2, [place]

## 2022-09-22 NOTE — Assessment & Plan Note (Signed)
Strain in the knee after acute trauma  Gradually improving  Continue with PT rx  May need MRI if not improved in 2 weeks  F/u [facility] FMLA paperwork completed

## 2022-09-27 ENCOUNTER — Other Ambulatory Visit: Payer: Self-pay

## 2022-09-29 ENCOUNTER — Ambulatory Visit: Payer: Medicaid Other | Admitting: Dietician

## 2022-10-04 ENCOUNTER — Ambulatory Visit (INDEPENDENT_AMBULATORY_CARE_PROVIDER_SITE_OTHER): Payer: Commercial Managed Care - PPO | Admitting: Sports Medicine

## 2022-10-04 VITALS — BP 162/108 | Ht 66.0 in | Wt 195.0 lb

## 2022-10-04 DIAGNOSIS — M79651 Pain in right thigh: Secondary | ICD-10-CM

## 2022-10-04 NOTE — Progress Notes (Addendum)
PCP: Erskine Emery, MD  Subjective:   CC: Right knee followup, work note  HPI: Patient is a 42 y.o. male here for reevaluation of right knee injury.  Patient was last evaluated here after trauma to the knee. At that time, he had difficulty ambulating on the R leg and was given crutches to use. Since then, he has felt better able to walk on the leg and would like to return to full work duty. He notes some discomfort on his thigh while walking, but his knee feels better. When having to use a step stool, he prefers to step up with his left leg knowing that his right leg is weaker/uncomfortable.  BP (!) 162/108   Ht 5\' 6"  (1.676 m)   Wt 195 lb (88.5 kg)   BMI 31.47 kg/m      Objective:  Physical Exam:  Gen: NAD, comfortable in exam room  MSK- R Knee and hip:  No gross deformities, swelling, or effusion.  No TTP along knee joint or thigh. Full ROM.  Strength 4-/5 on flexion and extension compared to left. Strength 4-/5 on hip flexion and extension compared to left.  Knee joint stable.  Antalgic gait, though improved from prior.   Assessment & Plan:  1. R knee/thigh pain, RLE weakness - Likely had a quadriceps strain given benign exams in the past and discomfort noted in thigh rather than joint. Patient has continued to improve and motivated to return to work. However, his R leg continues to be weak compared to L.  - Released work restrictions, given work note - Follow up in 4 weeks to reevaluate strength  Estevan Oaks, [place] [facility]  Addendum:  I was the preceptor for this visit and available for immediate consultation.  Karlton Lemon MD Kirt Boys

## 2022-10-06 ENCOUNTER — Telehealth: Payer: Self-pay | Admitting: Student

## 2022-10-06 NOTE — Telephone Encounter (Signed)
patient dropped off form at front desk for [facility]'s Statement.  Verified that patient section of form has been completed.  Last DOS/WCC with PCP was 09/22/22.  Placed form in blue team folder to be completed by clinical staff.  Creig Hines

## 2022-10-06 NOTE — Telephone Encounter (Signed)
Clinical info completed on form.  Placed form in PCP's box for completion.    When form is completed, please route note to "RN Team" and place in wall pocket in front office.   Salvatore Marvel, CMA

## 2022-10-10 ENCOUNTER — Other Ambulatory Visit: Payer: Self-pay

## 2022-10-11 ENCOUNTER — Telehealth: Payer: Self-pay | Admitting: Neurology

## 2022-10-11 NOTE — Telephone Encounter (Signed)
[employer] cone no Roxanna Mew via automachine ref # [id number] & MCD wellcare Josem Kaufmann: [id number] (exp. 10/11/22 to 01/09/23)

## 2022-10-11 NOTE — Telephone Encounter (Signed)
HST- [employer] cone no auth req via auto machine ref # [id number] & M[employer] wellcare auth: [id number] (exp. 10/11/22 to 01/09/23)- pt chose   Patient is scheduled at [facility] for 11/07/22 at 8:30 AM.  Anola Gurney message of appointment information.

## 2022-10-11 NOTE — Telephone Encounter (Signed)
Late Documentation- Patient called on 10/09/22 and informed that forms are ready for pick up. Copy made and placed in batch scanning. Original placed at front desk for pick up.   Talbot Grumbling, RN

## 2022-10-13 ENCOUNTER — Other Ambulatory Visit: Payer: Self-pay

## 2022-10-16 ENCOUNTER — Other Ambulatory Visit: Payer: Self-pay

## 2022-10-17 ENCOUNTER — Other Ambulatory Visit: Payer: Self-pay

## 2022-10-18 ENCOUNTER — Other Ambulatory Visit: Payer: Self-pay

## 2022-10-19 ENCOUNTER — Other Ambulatory Visit: Payer: Self-pay

## 2022-10-20 ENCOUNTER — Other Ambulatory Visit: Payer: Self-pay

## 2022-10-25 ENCOUNTER — Ambulatory Visit: Payer: Medicaid Other | Admitting: Dietician

## 2022-10-27 ENCOUNTER — Other Ambulatory Visit (HOSPITAL_COMMUNITY): Payer: Self-pay

## 2022-10-30 ENCOUNTER — Other Ambulatory Visit: Payer: Self-pay

## 2022-10-30 ENCOUNTER — Telehealth: Payer: Self-pay

## 2022-10-30 NOTE — Telephone Encounter (Signed)
[facility] Patient Advocate Encounter  Patient's medication Kern Reap) have been couriered to [facility] from [employer] and will be administered on the patient next office visit on 11/07/22.  Ileene Patrick , [facility] Patient [facility] Phone: [phone] Fax:  [phone]

## 2022-11-01 ENCOUNTER — Ambulatory Visit (INDEPENDENT_AMBULATORY_CARE_PROVIDER_SITE_OTHER): Payer: Commercial Managed Care - PPO | Admitting: Sports Medicine

## 2022-11-01 VITALS — BP 144/94 | Ht 66.0 in | Wt 185.0 lb

## 2022-11-01 DIAGNOSIS — M79651 Pain in right thigh: Secondary | ICD-10-CM

## 2022-11-01 NOTE — Progress Notes (Signed)
Joshua Martin is here today for follow-up of his right thigh pain that started after a fall January 4 when he fell on his knee.  He was initially using crutches for ambulation. When I saw him last 10/04/2022 he had some improvement in his pain but still had some weakness and tenderness to palpation in his quad.  He reports he has been doing his quad sets and is back to full strength and full work. On exam today he had no tenderness to palpation of his quad.  Full strength with flexion, extension hip abduction and adduction.  Normal gait.  Quad strain, resolved.  Follow-up as needed  Addendum:  I was the preceptor for this visit and available for immediate consultation.  Karlton Lemon MD Kirt Boys

## 2022-11-06 ENCOUNTER — Other Ambulatory Visit: Payer: Self-pay

## 2022-11-06 ENCOUNTER — Ambulatory Visit (INDEPENDENT_AMBULATORY_CARE_PROVIDER_SITE_OTHER): Payer: Commercial Managed Care - PPO | Admitting: Pharmacist

## 2022-11-06 ENCOUNTER — Encounter: Payer: 59 | Admitting: Pharmacist

## 2022-11-06 DIAGNOSIS — B20 Human immunodeficiency virus [HIV] disease: Secondary | ICD-10-CM | POA: Diagnosis not present

## 2022-11-06 MED ORDER — CABOTEGRAVIR & RILPIVIRINE ER 600 & 900 MG/3ML IM SUER
1.0000 | Freq: Once | INTRAMUSCULAR | Status: AC
  Administered 2022-11-06: 1 via INTRAMUSCULAR

## 2022-11-06 NOTE — Progress Notes (Signed)
HPI: Joshua Martin is a 42 y.o. male who presents to the [facility] for [place] administration.  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   AZELASTINE HCL 0.15 % SOLN    Place into the nose as needed.   BETAMETHASONE VALERATE OINTMENT (VALISONE) 0.1 %    Apply topically as needed.   CABOTEGRAVIR & RILPIVIRINE ER (CABENUVA) 600 & 900 MG/3ML INJECTION    Inject 1 kit into the muscle every 30 (thirty) days.   CABOTEGRAVIR & RILPIVIRINE ER (CABENUVA) 600 & 900 MG/3ML INJECTION    Inject 1 kit into the muscle every 2 (two) months.   ERGOCALCIFEROL (VITAMIN D2) 1.25 MG (50000 UT) CAPSULE       FLUTICASONE (FLONASE) 50 MCG/ACT NASAL SPRAY    Place into both nostrils as needed.   HYDROXYZINE (ATARAX) 10 MG TABLET    Take 1 tablet (10 mg total) by mouth 3 (three) times daily as needed for agitation.   KETOCONAZOLE (NIZORAL) 2 % CREAM    APPLY TO THE AFFECTED AREA ON THE CHEST TWICE DAILY   LOSARTAN (COZAAR) 25 MG TABLET    Take 1 tablet (25 mg total) by mouth daily.   MELOXICAM (MOBIC) 15 MG TABLET    Take 1 tablet (15 mg total) by mouth daily as needed for pain.   METOPROLOL SUCCINATE (TOPROL-XL) 50 MG 24 HR TABLET    Take 1 tablet (50 mg total) by mouth daily. Take with or immediately following a meal.   NAPROXEN (NAPROSYN) 500 MG TABLET    Take 1 tablet (500 mg total) by mouth 2 (two) times daily  with a meal.   OLOPATADINE HCL 0.2 % SOLN    ceived the following from [facility]: Outside name: olopatadine (PATADAY) 0.2 % drop ophthalmic solution   PRAVASTATIN (PRAVACHOL) 20 MG TABLET    Take 1 tablet (20 mg total) by mouth at bedtime.   SEMAGLUTIDE-WEIGHT MANAGEMENT (WEGOVY) 0.25 MG/0.5ML SOAJ    Inject 0.25 mg into the skin once a week.  Modified Medications   No medications on file  Discontinued Medications   No medications on file    Allergies: Allergies  Allergen Reactions   Emtricitabine-Rilpivirine-Tenofovir Af     Other reaction(s): Other (See Comments), other/intolerance Reaction Type: Allergy; Severity: Mild; Reaction(s): Urticaria. Rash Reaction Type: Allergy; Severity: Mild; Reaction(s): Urticaria. Rash Reaction Type: Allergy; Severity: Mild; Reaction(s): Urticaria. Rash Reaction Type: Allergy; Severity: Mild; Reaction(s): Urticaria. Rash     Past Medical History: Past Medical History:  Diagnosis Date   Chest pain    Hypertension     Social History: Social History   Socioeconomic History   Marital status: Single    Spouse name: Not on file   Number of children: Not on file   Years of education: Not on file   Highest education level: Not on file  Occupational History  Not on file  Tobacco Use   Smoking status: Never   Smokeless tobacco: Never  Substance and Sexual Activity   Alcohol use: Yes    Comment: occ   Drug use: Yes    Types: Marijuana    Comment: "once in a blue moon"   Sexual activity: Not on file    Comment: declined condoms  Other Topics Concern   Not on file  Social History Narrative   Not on file   Social Determinants of Health   Financial Resource Strain: Not on file  Food Insecurity: Not on file  Transportation Needs: Not on file  Physical Activity: Not on file  Stress: Not on file  Social Connections: Not on file    Labs: Lab Results  Component Value Date   HIV1RNAQUANT Not Detected 09/11/2022    HIV1RNAQUANT Not Detected 07/05/2022   HIV1RNAQUANT Not Detected 06/07/2022   HIV1RNAVL <20 02/16/2022   CD4TABS 693 02/28/2022    RPR and STI Lab Results  Component Value Date   LABRPR NON-REACTIVE 07/05/2022   LABRPR NON-REACTIVE 02/28/2022    STI Results GC CT  03/17/2022  2:25 PM Negative    Negative  Negative    Negative   02/28/2022  2:34 PM Negative  Negative     Hepatitis B Lab Results  Component Value Date   HEPBSAB NON-REACTIVE 02/28/2022   HEPBSAG NON-REACTIVE 02/28/2022   HEPBCAB NON-REACTIVE 02/28/2022   Hepatitis C Lab Results  Component Value Date   HEPCAB NON-REACTIVE 02/28/2022   Hepatitis A Lab Results  Component Value Date   HAV REACTIVE (A) 02/28/2022   Lipids: Lab Results  Component Value Date   CHOL 237 (H) 08/02/2022   TRIG 61 08/02/2022   HDL 49 08/02/2022   CHOLHDL 4.8 08/02/2022   LDLCALC 178 (H) 08/02/2022    TARGET DATE: The 4th  Assessment: Joshua Martin presents today for his maintenance Cabenuva injections. Past injections were tolerated well without issues. No issues or new partners since last visit and politely decline STI testing.  Administered cabotegravir '600mg'$ /62m in left upper outer quadrant of the gluteal muscle. Administered rilpivirine 900 mg/390min the right upper outer quadrant of the gluteal muscle. No issues with injections. He will follow up in 2 months for next set of injections.  Plan: - Cabenuva injections administered - HIV RNA today - Next injections scheduled for 01/04/23 with Dr. WaJuleen Chinand 03/05/23 with me - Call with any issues or questions  Christalynn Boise L. Wahneta Derocher, PharmD, BCIDP, AAHIVP, CPColorado Citylinical Pharmacist Practitioner [facility]

## 2022-11-07 ENCOUNTER — Ambulatory Visit (INDEPENDENT_AMBULATORY_CARE_PROVIDER_SITE_OTHER): Payer: Commercial Managed Care - PPO | Admitting: Neurology

## 2022-11-07 ENCOUNTER — Encounter: Payer: 59 | Admitting: Pharmacist

## 2022-11-07 DIAGNOSIS — R519 Headache, unspecified: Secondary | ICD-10-CM

## 2022-11-07 DIAGNOSIS — R351 Nocturia: Secondary | ICD-10-CM

## 2022-11-07 DIAGNOSIS — G4733 Obstructive sleep apnea (adult) (pediatric): Secondary | ICD-10-CM | POA: Diagnosis not present

## 2022-11-07 DIAGNOSIS — G4730 Sleep apnea, unspecified: Secondary | ICD-10-CM

## 2022-11-07 DIAGNOSIS — R030 Elevated blood-pressure reading, without diagnosis of hypertension: Secondary | ICD-10-CM

## 2022-11-07 DIAGNOSIS — G4731 Primary central sleep apnea: Secondary | ICD-10-CM

## 2022-11-07 DIAGNOSIS — R0681 Apnea, not elsewhere classified: Secondary | ICD-10-CM

## 2022-11-07 DIAGNOSIS — E669 Obesity, unspecified: Secondary | ICD-10-CM

## 2022-11-07 DIAGNOSIS — Z9189 Other specified personal risk factors, not elsewhere classified: Secondary | ICD-10-CM

## 2022-11-07 DIAGNOSIS — R0683 Snoring: Secondary | ICD-10-CM

## 2022-11-09 ENCOUNTER — Telehealth: Payer: Self-pay

## 2022-11-09 LAB — HIV-1 RNA QUANT-NO REFLEX-BLD
HIV 1 RNA Quant: NOT DETECTED Copies/mL
HIV-1 RNA Quant, Log: NOT DETECTED Log cps/mL

## 2022-11-09 NOTE — Progress Notes (Signed)
Patient attempted to be outreached by Lazarus Gowda, PharmD Candidate on 11/08/2022   to discuss hypertension. Left voicemail for patient to return our call at their convenience at [phone].   Lazarus Gowda, PharmD Candidate   Joseph Art, Pharm.D. PGY-2 Ambulatory Care Pharmacy Resident

## 2022-11-09 NOTE — Progress Notes (Signed)
See procedure note.

## 2022-11-13 ENCOUNTER — Other Ambulatory Visit: Payer: Self-pay | Admitting: Student

## 2022-11-13 DIAGNOSIS — I10 Essential (primary) hypertension: Secondary | ICD-10-CM

## 2022-11-13 DIAGNOSIS — F419 Anxiety disorder, unspecified: Secondary | ICD-10-CM

## 2022-11-13 NOTE — Procedures (Signed)
 [facility]  HOME SLEEP TEST (Watch PAT) REPORT  STUDY DATE: 11/07/22  DOB: 02/26/1981  MRN: [id number]  ORDERING CLINICIAN: Lakitha Gordy, MD, PhD   REFERRING CLINICIAN: Maxwell, Allee, MD   CLINICAL INFORMATION/HISTORY: 42-year-old male with an underlying medical history of hypertension, hyperlipidemia, anxiety, and mild obesity, who reports snoring and sleep disruption, waking up with a sense of choking.  He has been told that he has pauses in his breathing.   Epworth sleepiness score: 6/24.  BMI: 30.1 kg/m  FINDINGS:   Sleep Summary:   Total Recording Time (hours, min): 7 hours, 0 min  Total Sleep Time (hours, min):  5 hours, 28 min  Percent REM (%):    12.6%   Respiratory Indices:   Calculated pAHI (per hour):  46.9/hour         REM pAHI:    40.8/hour       NREM pAHI: 47.8/hour  Central pAHI: 6.3/hour  Oxygen Saturation Statistics:    Oxygen Saturation (%) Mean: 96%   Minimum oxygen saturation (%):                 87%   O2 Saturation Range (%): 87-99%    O2 Saturation (minutes) <=88%: 0.1 min  Pulse Rate Statistics:   Pulse Mean (bpm):    76/min    Pulse Range (59-103/min)   IMPRESSION: OSA (obstructive sleep apnea), severe Central Sleep Apnea   RECOMMENDATION:  This home sleep test demonstrates severe obstructive sleep apnea with a total AHI of 46.9/hour and O2 nadir of 87%.  There was a milder central sleep apnea component.  Snoring was in the mild to moderate range, at times louder. Treatment with positive airway pressure is highly recommended. This will require - ideally - a full night CPAP titration study for proper treatment settings, O2 monitoring and mask fitting. For now, the patient will be advised to proceed with an autoPAP titration/trial at home. A laboratory attended titration study can be considered in the future for optimization of treatment settings and to improve tolerance and compliance. Alternative treatment  options are limited secondary to the severity of the patient's sleep disordered breathing, but may include surgical treatment with an implantable hypoglossal nerve stimulator (in carefully selected candidates, meeting criteria).  Concomitant weight loss is recommended, where clinically appropriate. Please note, that untreated obstructive sleep apnea may carry additional perioperative morbidity. Patients with significant obstructive sleep apnea should receive perioperative PAP therapy and the surgeons and particularly the anesthesiologist should be informed of the diagnosis and the severity of the sleep disordered breathing. The patient should be cautioned not to drive, work at heights, or operate dangerous or heavy equipment when tired or sleepy. Review and reiteration of good sleep hygiene measures should be pursued with any patient. Other causes of the patient's symptoms, including circadian rhythm disturbances, an underlying mood disorder, medication effect and/or an underlying medical problem cannot be ruled out based on this test. Clinical correlation is recommended.  The patient and his referring provider will be notified of the test results. The patient will be seen in follow up in sleep clinic at [facility].  I certify that I have reviewed the raw data recording prior to the issuance of this report in accordance with the standards of the American Academy of Sleep Medicine (AASM).  INTERPRETING PHYSICIAN:   Poseidon Pam, MD, PhD Medical Director, [facility] ([facility]) Diplomat, ABPN (Neurology and Sleep)   [facility] [address], [address]   [place], [place] [address] [phone]         

## 2022-11-13 NOTE — Addendum Note (Signed)
Addended by: Star Age on: 11/13/2022 04:52 PM   Modules accepted: Orders

## 2022-11-14 ENCOUNTER — Other Ambulatory Visit: Payer: Self-pay

## 2022-11-14 MED ORDER — LOSARTAN POTASSIUM 25 MG PO TABS
25.0000 mg | ORAL_TABLET | Freq: Every day | ORAL | 2 refills | Status: DC
  Filled 2022-11-14: qty 30, 30d supply, fill #0
  Filled 2023-01-01: qty 30, 30d supply, fill #1
  Filled 2023-02-01: qty 30, 30d supply, fill #2

## 2022-11-14 MED ORDER — HYDROXYZINE HCL 10 MG PO TABS
10.0000 mg | ORAL_TABLET | Freq: Three times a day (TID) | ORAL | 0 refills | Status: DC | PRN
  Filled 2022-11-14: qty 90, 30d supply, fill #0

## 2022-11-16 ENCOUNTER — Telehealth: Payer: Self-pay

## 2022-11-16 ENCOUNTER — Other Ambulatory Visit: Payer: Self-pay

## 2022-11-16 NOTE — Telephone Encounter (Signed)
I called pt. I advised pt that Dr. Rexene Alberts reviewed their sleep study results and found that pt has severe osa. Dr. Rexene Alberts recommends that pt start an auto-pap at home. I reviewed PAP compliance expectations with the pt. Pt is agreeable to starting an auto-PAP. I advised pt that an order will be sent to a DME, [facility], and [facility] will call the pt within about one week after they file with the pt's insurance. [facility] will show the pt how to use the machine, fit for masks, and troubleshoot the auto-PAP if needed. A follow up appt was made for insurance purposes with Jinny Blossom, NP on 03/07/23 at 11:30am. Pt verbalized understanding to arrive 15 minutes early and bring their auto-PAP. Pt verbalized understanding of results. Pt had no questions at this time but was encouraged to call back if questions arise. I have sent the order to [facility] and have received confirmation that they have received the order.

## 2022-11-16 NOTE — Telephone Encounter (Signed)
-----   Message from Star Age, MD sent at 11/13/2022  4:52 PM EDT ----- Patient referred by PCP, seen by me on 09/13/2022, patient had a HST on 11/07/2022.    Please call and notify the patient that the recent home sleep test showed obstructive sleep apnea in the severe range. I recommend treatment for this in the form of autoPAP, which means, that we don't have to bring him in for a sleep study with CPAP, but will let him start using a so called autoPAP machine at home, through a DME company (of his choice, or as per insurance requirement). The DME representative will fit the patient with a mask of choice, educate him on how to use the machine, how to put the mask on, etc. I have placed an order in the chart. Please send the order to a local DME, talk to patient, send report to referring MD. Please also reinforce the need for compliance with treatment. We will need a FU in sleep clinic for 10 weeks post-PAP set up, please arrange that with me or one of our NPs. Thanks,   Star Age, MD, PhD [facility])

## 2022-11-16 NOTE — Telephone Encounter (Signed)
I called patient to discuss. No answer, left a message asking him to call us back.  

## 2022-11-16 NOTE — Telephone Encounter (Signed)
[facility] does not accept patient's insurance. Will send to [facility].

## 2022-11-27 DIAGNOSIS — G4733 Obstructive sleep apnea (adult) (pediatric): Secondary | ICD-10-CM | POA: Diagnosis not present

## 2022-12-04 ENCOUNTER — Telehealth: Payer: Self-pay | Admitting: Pharmacist

## 2022-12-04 NOTE — Progress Notes (Signed)
Patient attempted to be outreached by Darrall Dears, PharmD Candidate on 12/04/2022 to discuss hypertension. Left voicemail for patient to return our call at their convenience at [phone].   Darrall Dears  PharmD Candidate   Catie Hedwig Morton, PharmD, [place], [facility] [phone]

## 2022-12-22 ENCOUNTER — Other Ambulatory Visit: Payer: Self-pay

## 2022-12-22 ENCOUNTER — Other Ambulatory Visit (HOSPITAL_COMMUNITY): Payer: Self-pay

## 2023-01-01 ENCOUNTER — Other Ambulatory Visit: Payer: Self-pay | Admitting: Student

## 2023-01-01 ENCOUNTER — Other Ambulatory Visit: Payer: Self-pay

## 2023-01-01 DIAGNOSIS — F419 Anxiety disorder, unspecified: Secondary | ICD-10-CM

## 2023-01-02 ENCOUNTER — Other Ambulatory Visit (HOSPITAL_COMMUNITY)
Admission: RE | Admit: 2023-01-02 | Discharge: 2023-01-02 | Disposition: A | Discharge status: Home / Self Care | Payer: Commercial Managed Care - PPO | Source: Ambulatory Visit | Attending: Internal Medicine | Admitting: Internal Medicine

## 2023-01-02 ENCOUNTER — Ambulatory Visit (INDEPENDENT_AMBULATORY_CARE_PROVIDER_SITE_OTHER): Payer: Commercial Managed Care - PPO | Admitting: Internal Medicine

## 2023-01-02 ENCOUNTER — Other Ambulatory Visit (HOSPITAL_COMMUNITY): Payer: Self-pay

## 2023-01-02 ENCOUNTER — Other Ambulatory Visit: Payer: Self-pay

## 2023-01-02 ENCOUNTER — Encounter: Payer: Self-pay | Admitting: Internal Medicine

## 2023-01-02 VITALS — BP 138/92 | HR 78 | Temp 98.2°F | Ht 66.0 in | Wt 183.0 lb

## 2023-01-02 DIAGNOSIS — Z129 Encounter for screening for malignant neoplasm, site unspecified: Secondary | ICD-10-CM | POA: Insufficient documentation

## 2023-01-02 DIAGNOSIS — B20 Human immunodeficiency virus [HIV] disease: Secondary | ICD-10-CM

## 2023-01-02 DIAGNOSIS — Z113 Encounter for screening for infections with a predominantly sexual mode of transmission: Secondary | ICD-10-CM | POA: Diagnosis not present

## 2023-01-02 MED ORDER — CABOTEGRAVIR & RILPIVIRINE ER 600 & 900 MG/3ML IM SUER
1.0000 | Freq: Once | INTRAMUSCULAR | Status: AC
  Administered 2023-01-02: 1 via INTRAMUSCULAR

## 2023-01-02 MED ORDER — HYDROXYZINE HCL 10 MG PO TABS
10.0000 mg | ORAL_TABLET | Freq: Three times a day (TID) | ORAL | 0 refills | Status: DC | PRN
  Filled 2023-01-02: qty 90, 30d supply, fill #0

## 2023-01-02 NOTE — Assessment & Plan Note (Signed)
Obtain anal cytology for screening today.  If abnormal, will refer for HRA.

## 2023-01-02 NOTE — Assessment & Plan Note (Signed)
Patient presents for routine HIV follow-up.  He is currently on [place] since October 2023.  His last injection was 11/06/2022 with pharmacy.  He is tolerating well with no concerns at this time.  His viral load remained undetectable in March at his last injection appointment.  Will give Cabenuva, check viral load, and follow-up up in 2 months with pharmacy.

## 2023-01-02 NOTE — Addendum Note (Signed)
Addended by: Philippa Chester on: 01/02/2023 02:16 PM   Modules accepted: Orders

## 2023-01-02 NOTE — Progress Notes (Signed)
[facility]   CHIEF COMPLAINT    HIV follow up.    SUBJECTIVE:    Joshua Martin is a 42 y.o. male with PMHx as below who presents to the clinic for HIV follow up.   Please see A&P for the details of today's visit and status of the patient's medical problems.   Patient's Medications  New Prescriptions   No medications on file  Previous Medications   AMMONIUM LACTATE (AMLACTIN) 12 % CREAM    as needed.   AZELASTINE HCL 0.15 % SOLN    Place into the nose as needed.   BETAMETHASONE VALERATE OINTMENT (VALISONE) 0.1 %    Apply topically as needed.   CABOTEGRAVIR & RILPIVIRINE ER (CABENUVA) 600 & 900 MG/3ML INJECTION    Inject 1 kit into the muscle every 30 (thirty) days.   CABOTEGRAVIR & RILPIVIRINE ER (CABENUVA) 600 & 900 MG/3ML INJECTION    Inject 1 kit into the muscle every 2 (two) months.   ERGOCALCIFEROL (VITAMIN D2) 1.25 MG (50000 UT) CAPSULE       FLUTICASONE (FLONASE) 50 MCG/ACT NASAL SPRAY    Place into both nostrils as needed.   HYDROXYZINE (ATARAX) 10 MG TABLET    Take 1 tablet (10 mg total) by mouth 3 (three) times daily as needed for agitation.   KETOCONAZOLE (NIZORAL) 2 % CREAM    APPLY TO THE AFFECTED AREA ON THE CHEST TWICE DAILY   LOSARTAN (COZAAR) 25 MG TABLET    Take 1 tablet (25 mg total) by mouth daily.   MELOXICAM (MOBIC) 15 MG TABLET    Take 1 tablet (15 mg total) by mouth daily as needed for pain.   METOPROLOL SUCCINATE (TOPROL-XL) 50 MG 24 HR TABLET    Take 1 tablet (50 mg total) by mouth daily. Take with or immediately following a meal.   NAPROXEN (NAPROSYN) 500 MG TABLET    Take 1 tablet (500 mg total) by mouth 2 (two) times daily with a meal.   OLOPATADINE HCL 0.2 % SOLN    ceived the following from [facility]: Outside name: olopatadine (PATADAY) 0.2 % drop ophthalmic solution   PRAVASTATIN (PRAVACHOL) 20 MG TABLET    Take 1 tablet (20 mg total) by mouth at bedtime.   SEMAGLUTIDE-WEIGHT MANAGEMENT  (WEGOVY) 0.25 MG/0.5ML SOAJ    Inject 0.25 mg into the skin once a week.  Modified Medications   No medications on file  Discontinued Medications   No medications on file      Past Medical History:  Diagnosis Date   Chest pain    Hypertension     Social History   Tobacco Use   Smoking status: Never   Smokeless tobacco: Never  Substance Use Topics   Alcohol use: Yes    Comment: occ   Drug use: Yes    Types: Marijuana    Comment: "once in a blue moon"    Family History  Problem Relation Age of Onset   Coronary artery disease Father    CAD Paternal Grandmother    Sleep apnea Neg Hx     Allergies  Allergen Reactions   Emtricitabine-Rilpivirine-Tenofovir Af     Other reaction(s): Other (See Comments), other/intolerance Reaction Type: Allergy; Severity: Mild; Reaction(s): Urticaria. Rash Reaction Type: Allergy; Severity: Mild; Reaction(s): Urticaria. Rash Reaction Type: Allergy; Severity: Mild; Reaction(s): Urticaria. Rash Reaction Type: Allergy; Severity: Mild; Reaction(s): Urticaria. Rash     Review of Systems  Constitutional: Negative.   Gastrointestinal: Negative.      OBJECTIVE:    Vitals:   01/02/23 1339  BP: (!) 138/92  Pulse: 78  Temp: 98.2 F (36.8 C)  TempSrc: Temporal  SpO2: 100%  Weight: 183 lb (83 kg)  Height: 5\' 6"  (1.676 m)     Body mass index is 29.54 kg/m.  Physical Exam Constitutional:      Appearance: Normal appearance.  HENT:     Nose: Nose normal.  Eyes:     Extraocular Movements: Extraocular movements intact.     Conjunctiva/sclera: Conjunctivae normal.  Pulmonary:     Effort: Pulmonary effort is normal. No respiratory distress.  Abdominal:     General: There is no distension.     Palpations: Abdomen is soft.  Skin:    General: Skin is warm and dry.  Neurological:     General: No focal deficit present.     Mental Status: He is alert and oriented to person, place, and time.  Psychiatric:        Mood and Affect: Mood  normal.        Behavior: Behavior normal.     Labs and Microbiology:    Latest Ref Rng & Units 08/16/2022   12:40 PM 02/28/2022    2:23 AM 02/15/2022    3:43 PM  CMP  Glucose 70 - 99 mg/dL 90  71  79   BUN 6 - 24 mg/dL 13  17  16    Creatinine 0.76 - 1.27 mg/dL 1.61  0.96  0.45   Sodium 134 - 144 mmol/L 144  141  140   Potassium 3.5 - 5.2 mmol/L 4.3  4.0  4.0   Chloride 96 - 106 mmol/L 105  107  107   CO2 20 - 29 mmol/L 23  26  23    Calcium 8.7 - 10.2 mg/dL 9.4  9.3  9.7   Total Protein 6.1 - 8.1 g/dL  6.7  7.1   Total Bilirubin 0.2 - 1.2 mg/dL  0.4  0.7   Alkaline Phos 38 - 126 U/L   45   AST 10 - 40 U/L  14  14   ALT 9 - 46 U/L  11  14       Latest Ref Rng & Units 09/11/2022   10:51 AM 02/28/2022    2:23 AM 02/15/2022    3:43 PM  CBC  WBC 3.4 - 10.8 x10E3/uL 5.6  7.1  7.0   Hemoglobin 13.0 - 17.7 g/dL 40.9  81.1  91.4   Hematocrit 37.5 - 51.0 % 39.4  36.1  41.7   Platelets 150 - 450 x10E3/uL 269  264  296      Lab Results  Component Value Date   HIV1RNAQUANT Not Detected 11/06/2022   HIV1RNAQUANT Not Detected 09/11/2022   HIV1RNAQUANT Not Detected 07/05/2022   HIV1RNAVL <20 02/16/2022   CD4TABS 693 02/28/2022    RPR and STI: Lab Results  Component Value Date   LABRPR NON-REACTIVE 07/05/2022   LABRPR NON-REACTIVE 02/28/2022    STI Results GC CT  03/17/2022  2:25 PM Negative    Negative  Negative    Negative   02/28/2022  2:34 PM Negative  Negative     Hepatitis B: Lab Results  Component Value Date   HEPBSAB NON-REACTIVE 02/28/2022   HEPBSAG NON-REACTIVE 02/28/2022   HEPBCAB NON-REACTIVE 02/28/2022   Hepatitis C: Lab Results  Component Value Date   HEPCAB NON-REACTIVE 02/28/2022   Hepatitis A: Lab Results  Component Value Date   HAV REACTIVE (A) 02/28/2022   Lipids: Lab Results  Component Value Date   CHOL 237 (H) 08/02/2022   TRIG 61 08/02/2022   HDL 49 08/02/2022   CHOLHDL 4.8 08/02/2022   LDLCALC 178 (H) 08/02/2022     ASSESSMENT &  PLAN:    HIV infection (HCC) Patient presents for routine HIV follow-up.  He is currently on [place] since October 2023.  His last injection was 11/06/2022 with pharmacy.  He is tolerating well with no concerns at this time.  His viral load remained undetectable in March at his last injection appointment.  Will give Cabenuva, check viral load, and follow-up up in 2 months with pharmacy.  Cancer screening Obtain anal cytology for screening today.  If abnormal, will refer for HRA.   Routine screening for STI (sexually transmitted infection) Screening offered today and declined.    Orders Placed This Encounter  Procedures   HIV-1 RNA quant-no reflex-bld      Vedia Coffer for Infectious Disease [facility] 01/02/2023, 1:49 PM

## 2023-01-02 NOTE — Assessment & Plan Note (Signed)
Screening offered today and declined.  

## 2023-01-03 ENCOUNTER — Telehealth: Payer: Self-pay

## 2023-01-03 DIAGNOSIS — G4733 Obstructive sleep apnea (adult) (pediatric): Secondary | ICD-10-CM | POA: Diagnosis not present

## 2023-01-03 NOTE — Telephone Encounter (Signed)
[facility] Patient Advocate Encounter  Patient's medication Renaldo Harrison) have been couriered to [facility] from [facility] and was administered on the patient office visit on 01/02/23.  Clearance Coots , CPhT Specialty Pharmacy Patient [facility] Phone: [phone] Fax:  [phone]

## 2023-01-04 ENCOUNTER — Ambulatory Visit: Payer: Commercial Managed Care - PPO | Admitting: Internal Medicine

## 2023-01-05 LAB — HIV-1 RNA QUANT-NO REFLEX-BLD
HIV 1 RNA Quant: NOT DETECTED Copies/mL
HIV-1 RNA Quant, Log: NOT DETECTED Log cps/mL

## 2023-01-09 LAB — CYTOLOGY - PAP: Adequacy: ABNORMAL

## 2023-01-10 NOTE — Progress Notes (Signed)
Sounds good! Is that a self collected thing? I've never done one so would just need some education :)

## 2023-01-11 NOTE — Progress Notes (Signed)
Is it similar to how they do a rectal cytology swab?

## 2023-01-15 ENCOUNTER — Encounter: Payer: Self-pay | Admitting: Internal Medicine

## 2023-01-22 ENCOUNTER — Other Ambulatory Visit: Payer: Self-pay

## 2023-01-30 ENCOUNTER — Encounter: Payer: Self-pay | Admitting: Family Medicine

## 2023-01-30 ENCOUNTER — Other Ambulatory Visit (HOSPITAL_COMMUNITY)
Admission: RE | Admit: 2023-01-30 | Discharge: 2023-01-30 | Disposition: A | Discharge status: Home / Self Care | Payer: Commercial Managed Care - PPO | Source: Ambulatory Visit | Attending: Family Medicine | Admitting: Family Medicine

## 2023-01-30 ENCOUNTER — Other Ambulatory Visit: Payer: Self-pay

## 2023-01-30 ENCOUNTER — Ambulatory Visit (INDEPENDENT_AMBULATORY_CARE_PROVIDER_SITE_OTHER): Payer: Commercial Managed Care - PPO | Admitting: Family Medicine

## 2023-01-30 VITALS — BP 138/94 | HR 74 | Ht 66.0 in | Wt 186.0 lb

## 2023-01-30 DIAGNOSIS — Z129 Encounter for screening for malignant neoplasm, site unspecified: Secondary | ICD-10-CM

## 2023-01-30 DIAGNOSIS — Z113 Encounter for screening for infections with a predominantly sexual mode of transmission: Secondary | ICD-10-CM | POA: Insufficient documentation

## 2023-01-30 DIAGNOSIS — I10 Essential (primary) hypertension: Secondary | ICD-10-CM

## 2023-01-30 DIAGNOSIS — F419 Anxiety disorder, unspecified: Secondary | ICD-10-CM | POA: Diagnosis not present

## 2023-01-30 MED ORDER — HYDROXYZINE HCL 10 MG PO TABS
10.0000 mg | ORAL_TABLET | Freq: Three times a day (TID) | ORAL | 0 refills | Status: DC | PRN
  Filled 2023-01-30: qty 90, 30d supply, fill #0

## 2023-01-30 NOTE — Patient Instructions (Addendum)
It was great to see you!  Things we discussed at today's visit: - We did routine testing for sexually transmitted infections. I will send you a MyChart message with the results or call if needed. - We also did a test to screen for anogenital cancers and I will send a copy of the results to Dr Earlene Plater. - Be sure to always use a condom to prevent transmission of sexually transmitted infections - Start checking your blood pressure daily at home. Keep a written log of the numbers and bring it to your next appointment. Return in ~2 weeks for a blood pressure visit.  Take care and seek immediate care sooner if you develop any concerns.  Dr. [facility]

## 2023-01-30 NOTE — Progress Notes (Signed)
    SUBJECTIVE:   CHIEF COMPLAINT / HPI:   STI Testing Patient reports a burning pain in his penis that occurred one time last week when he was showering.  Pain has since resolved and not recurred, but he would like to be checked for STIs.  No dysuria, urinary frequency, penile discharge, anogenital lesions, or other complaints.  He did have 1 new sexual partner recently.  Sexually active with male partners, has receptive anal intercourse.  H/o HIV, follows with ID-- last seen 01/02/23. On Cabenuva. Viral load undetectable  PERTINENT  PMH / PSH: HIV (see HPI), HTN  OBJECTIVE:   BP (!) 138/94   Pulse 74   Ht 5\' 6"  (1.676 m)   Wt 186 lb (84.4 kg)   SpO2 98%   BMI 30.02 kg/m   General: NAD, pleasant, able to participate in exam Respiratory: No respiratory distress Skin: warm and dry, no rashes noted Psych: Normal affect and mood Neuro: grossly intact Anogenital: Exam performed in the presence of a chaperone, normal male, no lesions or other abnormalities noted  ASSESSMENT/PLAN:   Routine screening for STI (sexually transmitted infection) -Urine GC/chlamydia/trich obtained today -Oropharyngeal GC/chlamydia/trich swab obtained -Rectal STI testing done on anal pap -RPR -Will f/u results -Advised condom use  Cancer screening Prior anal cytology sample from ID office was unsatisfactory sample. Repeat anal pap obtained today  Hypertension BP elevated x2. Advised home BP monitoring and return in 2 weeks for BP visit.      Maury Dus, MD [facility]

## 2023-01-30 NOTE — Assessment & Plan Note (Addendum)
BP elevated x2. Advised home BP monitoring and return in 2 weeks for BP visit.

## 2023-01-30 NOTE — Assessment & Plan Note (Addendum)
-  Urine GC/chlamydia/trich obtained today -Oropharyngeal GC/chlamydia/trich swab obtained -Rectal STI testing done on anal pap -RPR -Will f/u results -Advised condom use

## 2023-01-30 NOTE — Assessment & Plan Note (Signed)
Prior anal cytology sample from ID office was unsatisfactory sample. Repeat anal pap obtained today

## 2023-01-31 LAB — URINE CYTOLOGY ANCILLARY ONLY
Chlamydia: NEGATIVE
Comment: NEGATIVE
Comment: NEGATIVE
Comment: NORMAL
Neisseria Gonorrhea: NEGATIVE
Trichomonas: NEGATIVE

## 2023-01-31 LAB — CERVICOVAGINAL ANCILLARY ONLY
Chlamydia: NEGATIVE
Comment: NEGATIVE
Comment: NEGATIVE
Comment: NORMAL
Neisseria Gonorrhea: NEGATIVE
Trichomonas: NEGATIVE

## 2023-01-31 LAB — RPR: RPR Ser Ql: NONREACTIVE

## 2023-02-01 ENCOUNTER — Other Ambulatory Visit: Payer: Self-pay

## 2023-02-05 LAB — CYTOLOGY - PAP
Chlamydia: NEGATIVE
Comment: NEGATIVE
Comment: NEGATIVE
Comment: NEGATIVE
Comment: NORMAL
Diagnosis: NEGATIVE
High risk HPV: NEGATIVE
Neisseria Gonorrhea: NEGATIVE
Trichomonas: NEGATIVE

## 2023-02-12 ENCOUNTER — Telehealth: Payer: Self-pay

## 2023-02-12 NOTE — Telephone Encounter (Signed)
Patient left a VM this morning at 9:15am advising [name] that he needs to cancel his appointment for tomorrow due for a family emergency. He is asking for a call to reschedule. I have cancelled his 02/13/23 appointment.

## 2023-02-13 ENCOUNTER — Encounter: Payer: Self-pay | Admitting: Pharmacist

## 2023-02-13 ENCOUNTER — Encounter: Payer: Commercial Managed Care - PPO | Admitting: Adult Health

## 2023-02-15 ENCOUNTER — Other Ambulatory Visit: Payer: Self-pay

## 2023-02-15 NOTE — Progress Notes (Signed)
Patient attempted to be outreached by Minette Brine, PharmD Candidate on 02/15/23 to discuss hypertension. Left voicemail for patient to return our call at their convenience at [phone].  Minette Brine, Student-PharmD

## 2023-02-16 ENCOUNTER — Ambulatory Visit (INDEPENDENT_AMBULATORY_CARE_PROVIDER_SITE_OTHER): Payer: Commercial Managed Care - PPO | Admitting: Student

## 2023-02-16 DIAGNOSIS — Z91199 Patient's noncompliance with other medical treatment and regimen due to unspecified reason: Secondary | ICD-10-CM

## 2023-02-16 NOTE — Progress Notes (Unsigned)
No show to appt.

## 2023-02-19 ENCOUNTER — Other Ambulatory Visit (HOSPITAL_COMMUNITY): Payer: Self-pay

## 2023-02-27 ENCOUNTER — Other Ambulatory Visit (HOSPITAL_COMMUNITY): Payer: Self-pay

## 2023-02-27 DIAGNOSIS — G4733 Obstructive sleep apnea (adult) (pediatric): Secondary | ICD-10-CM | POA: Diagnosis not present

## 2023-03-01 ENCOUNTER — Telehealth: Payer: Self-pay | Admitting: Pharmacy Technician

## 2023-03-01 NOTE — Telephone Encounter (Signed)
[facility] Patient Advocate Encounter  Patient's medication, Joshua Martin has been couriered to [facility] from [facility] and will be administered at pt's next visit  03/05/23.

## 2023-03-05 ENCOUNTER — Ambulatory Visit: Payer: Commercial Managed Care - PPO | Admitting: Pharmacist

## 2023-03-07 ENCOUNTER — Other Ambulatory Visit: Payer: Self-pay | Admitting: Student

## 2023-03-07 ENCOUNTER — Ambulatory Visit (INDEPENDENT_AMBULATORY_CARE_PROVIDER_SITE_OTHER): Payer: Commercial Managed Care - PPO | Admitting: Pharmacist

## 2023-03-07 ENCOUNTER — Other Ambulatory Visit: Payer: Self-pay

## 2023-03-07 ENCOUNTER — Encounter: Payer: Commercial Managed Care - PPO | Admitting: Adult Health

## 2023-03-07 ENCOUNTER — Other Ambulatory Visit: Payer: Self-pay | Admitting: Family Medicine

## 2023-03-07 DIAGNOSIS — B20 Human immunodeficiency virus [HIV] disease: Secondary | ICD-10-CM | POA: Diagnosis not present

## 2023-03-07 DIAGNOSIS — I10 Essential (primary) hypertension: Secondary | ICD-10-CM

## 2023-03-07 DIAGNOSIS — F419 Anxiety disorder, unspecified: Secondary | ICD-10-CM

## 2023-03-07 MED ORDER — CABOTEGRAVIR & RILPIVIRINE ER 600 & 900 MG/3ML IM SUER
1.0000 | Freq: Once | INTRAMUSCULAR | Status: AC
  Administered 2023-03-07: 1 via INTRAMUSCULAR

## 2023-03-07 NOTE — Patient Instructions (Addendum)
[facility] [phone] [address] [place], [place] [address]  Next Cabenuva injections due on May 09, 2023.   Call [phone] with any issues!  - Kasyn Stouffer

## 2023-03-07 NOTE — Progress Notes (Signed)
HPI: Joshua Martin is a 42 y.o. male who presents to the [facility] pharmacy clinic for Paul administration.  Patient Active Problem List   Diagnosis Date Noted   Cancer screening 01/02/2023   Right knee pain 09/13/2022   BMI 30.0-30.9,adult 08/15/2022   Snoring 08/02/2022   Overeating 08/02/2022   Anxious mood 08/02/2022   Hyperlipidemia 08/02/2022   Vaccine counseling 03/17/2022   Routine screening for STI (sexually transmitted infection) 03/17/2022   Hematuria 03/17/2022   Family planning 03/17/2022   Nonintractable headache 02/17/2022   Chest pain 02/15/2022   HIV infection (HCC) 02/15/2022   Hypertension     Patient's Medications  New Prescriptions   No medications on file  Previous Medications   AMMONIUM LACTATE (AMLACTIN) 12 % CREAM    as needed.   AZELASTINE HCL 0.15 % SOLN    Place into the nose as needed.   BETAMETHASONE VALERATE OINTMENT (VALISONE) 0.1 %    Apply topically as needed.   CABOTEGRAVIR & RILPIVIRINE ER (CABENUVA) 600 & 900 MG/3ML INJECTION    Inject 1 kit into the muscle every 30 (thirty) days.   CABOTEGRAVIR & RILPIVIRINE ER (CABENUVA) 600 & 900 MG/3ML INJECTION    Inject 1 kit into the muscle every 2 (two) months.   ERGOCALCIFEROL (VITAMIN D2) 1.25 MG (50000 UT) CAPSULE       FLUTICASONE (FLONASE) 50 MCG/ACT NASAL SPRAY    Place into both nostrils as needed.   HYDROXYZINE (ATARAX) 10 MG TABLET    Take 1 tablet (10 mg total) by mouth 3 (three) times daily as needed for agitation.   KETOCONAZOLE (NIZORAL) 2 % CREAM    APPLY TO THE AFFECTED AREA ON THE CHEST TWICE DAILY   LOSARTAN (COZAAR) 25 MG TABLET    Take 1 tablet (25 mg total) by mouth daily.   MELOXICAM (MOBIC) 15 MG TABLET    Take 1 tablet (15 mg total) by mouth daily as needed for pain.   NAPROXEN (NAPROSYN) 500 MG TABLET    Take 1 tablet (500 mg total) by mouth 2 (two) times daily with a meal.   OLOPATADINE HCL 0.2 % SOLN    ceived the following from [facility]: Outside  name: olopatadine (PATADAY) 0.2 % drop ophthalmic solution   PRAVASTATIN (PRAVACHOL) 20 MG TABLET    Take 1 tablet (20 mg total) by mouth at bedtime.   SEMAGLUTIDE-WEIGHT MANAGEMENT (WEGOVY) 0.25 MG/0.5ML SOAJ    Inject 0.25 mg into the skin once a week.  Modified Medications   No medications on file  Discontinued Medications   No medications on file    Allergies: Allergies  Allergen Reactions   Emtricitabine-Rilpivirine-Tenofovir Af     Other reaction(s): Other (See Comments), other/intolerance Reaction Type: Allergy; Severity: Mild; Reaction(s): Urticaria. Rash Reaction Type: Allergy; Severity: Mild; Reaction(s): Urticaria. Rash Reaction Type: Allergy; Severity: Mild; Reaction(s): Urticaria. Rash Reaction Type: Allergy; Severity: Mild; Reaction(s): Urticaria. Rash     Past Medical History: Past Medical History:  Diagnosis Date   Chest pain    Hypertension     Social History: Social History   Socioeconomic History   Marital status: Single    Spouse name: Not on file   Number of children: Not on file   Years of education: Not on file   Highest education level: Not on file  Occupational History   Not on file  Tobacco Use   Smoking status: Never   Smokeless tobacco: Never  Substance and Sexual Activity  Alcohol use: Yes    Comment: occ   Drug use: Yes    Types: Marijuana    Comment: "once in a blue moon"   Sexual activity: Not on file    Comment: declined condoms  Other Topics Concern   Not on file  Social History Narrative   Not on file   Social Determinants of Health   Financial Resource Strain: Not on file  Food Insecurity: Not on file  Transportation Needs: Not on file  Physical Activity: Not on file  Stress: Not on file  Social Connections: Not on file    Labs: Lab Results  Component Value Date   HIV1RNAQUANT Not Detected 01/02/2023   HIV1RNAQUANT Not Detected 11/06/2022   HIV1RNAQUANT Not Detected 09/11/2022   HIV1RNAVL <20 02/16/2022    CD4TABS 693 02/28/2022    RPR and STI Lab Results  Component Value Date   LABRPR Non Reactive 01/30/2023   LABRPR NON-REACTIVE 07/05/2022   LABRPR NON-REACTIVE 02/28/2022    STI Results GC CT  01/30/2023 11:32 AM Negative    Negative  Negative    Negative   01/30/2023 11:00 AM Negative  Negative   03/17/2022  2:25 PM Negative    Negative  Negative    Negative   02/28/2022  2:34 PM Negative  Negative     Hepatitis B Lab Results  Component Value Date   HEPBSAB NON-REACTIVE 02/28/2022   HEPBSAG NON-REACTIVE 02/28/2022   HEPBCAB NON-REACTIVE 02/28/2022   Hepatitis C Lab Results  Component Value Date   HEPCAB NON-REACTIVE 02/28/2022   Hepatitis A Lab Results  Component Value Date   HAV REACTIVE (A) 02/28/2022   Lipids: Lab Results  Component Value Date   CHOL 237 (H) 08/02/2022   TRIG 61 08/02/2022   HDL 49 08/02/2022   CHOLHDL 4.8 08/02/2022   LDLCALC 178 (H) 08/02/2022    TARGET DATE: The 4th  Assessment: Howard presents today for his maintenance Cabenuva injections. Past injections were tolerated well without issues. He has moved to [place] and will be transferring care there. He signed a medical release today. I sent an internal referral to [facility] and gave him the address and phone number to call in a few weeks if he hasn't heard anything. Asked him to make sure he does it soon as he will need his next set of injections ~05/09/23. Last HIV RNA was undetectable in April.   Administered cabotegravir 600mg /35mL in left upper outer quadrant of the gluteal muscle. Administered rilpivirine 900 mg/68mL in the right upper outer quadrant of the gluteal muscle. No issues with injections.   Plan: - Cabenuva injections administered - Transfer care to [facility] - Call with any issues or questions  Lenward Able L. Kaydan Wong, PharmD, BCIDP, AAHIVP, CPP Clinical Pharmacist Practitioner Infectious Diseases Clinical Pharmacist [facility]

## 2023-03-11 MED ORDER — HYDROXYZINE HCL 10 MG PO TABS
10.0000 mg | ORAL_TABLET | Freq: Three times a day (TID) | ORAL | 0 refills | Status: AC | PRN
  Filled 2023-03-11: qty 90, 30d supply, fill #0

## 2023-03-11 MED ORDER — LOSARTAN POTASSIUM 25 MG PO TABS
25.0000 mg | ORAL_TABLET | Freq: Every day | ORAL | 2 refills | Status: AC
  Filled 2023-03-11: qty 30, 30d supply, fill #0

## 2023-03-11 NOTE — Telephone Encounter (Signed)
Needs appt before next refill 

## 2023-03-12 ENCOUNTER — Other Ambulatory Visit: Payer: Self-pay

## 2023-03-12 ENCOUNTER — Other Ambulatory Visit (HOSPITAL_COMMUNITY): Payer: Self-pay

## 2023-03-19 ENCOUNTER — Other Ambulatory Visit: Payer: Self-pay

## 2023-05-21 ENCOUNTER — Telehealth: Payer: Self-pay | Admitting: Pharmacist

## 2023-05-21 NOTE — Telephone Encounter (Signed)
Attempted to contact patient for follow-up of blood pressure/hypertension control.   Unable to leave HIPAA compliant voice mail requesting call back due mailbox full.  Total time with patient call and documentation of interaction: 4 minutes.

## 2023-09-29 IMAGING — DX DG CHEST 2V
2 series · 2 of 2 positions shown · non-contrast
Comparison: None

CLINICAL DATA: Chest pain; syncopechest pain

EXAM:
CHEST - 2 VIEW

[chest lat]
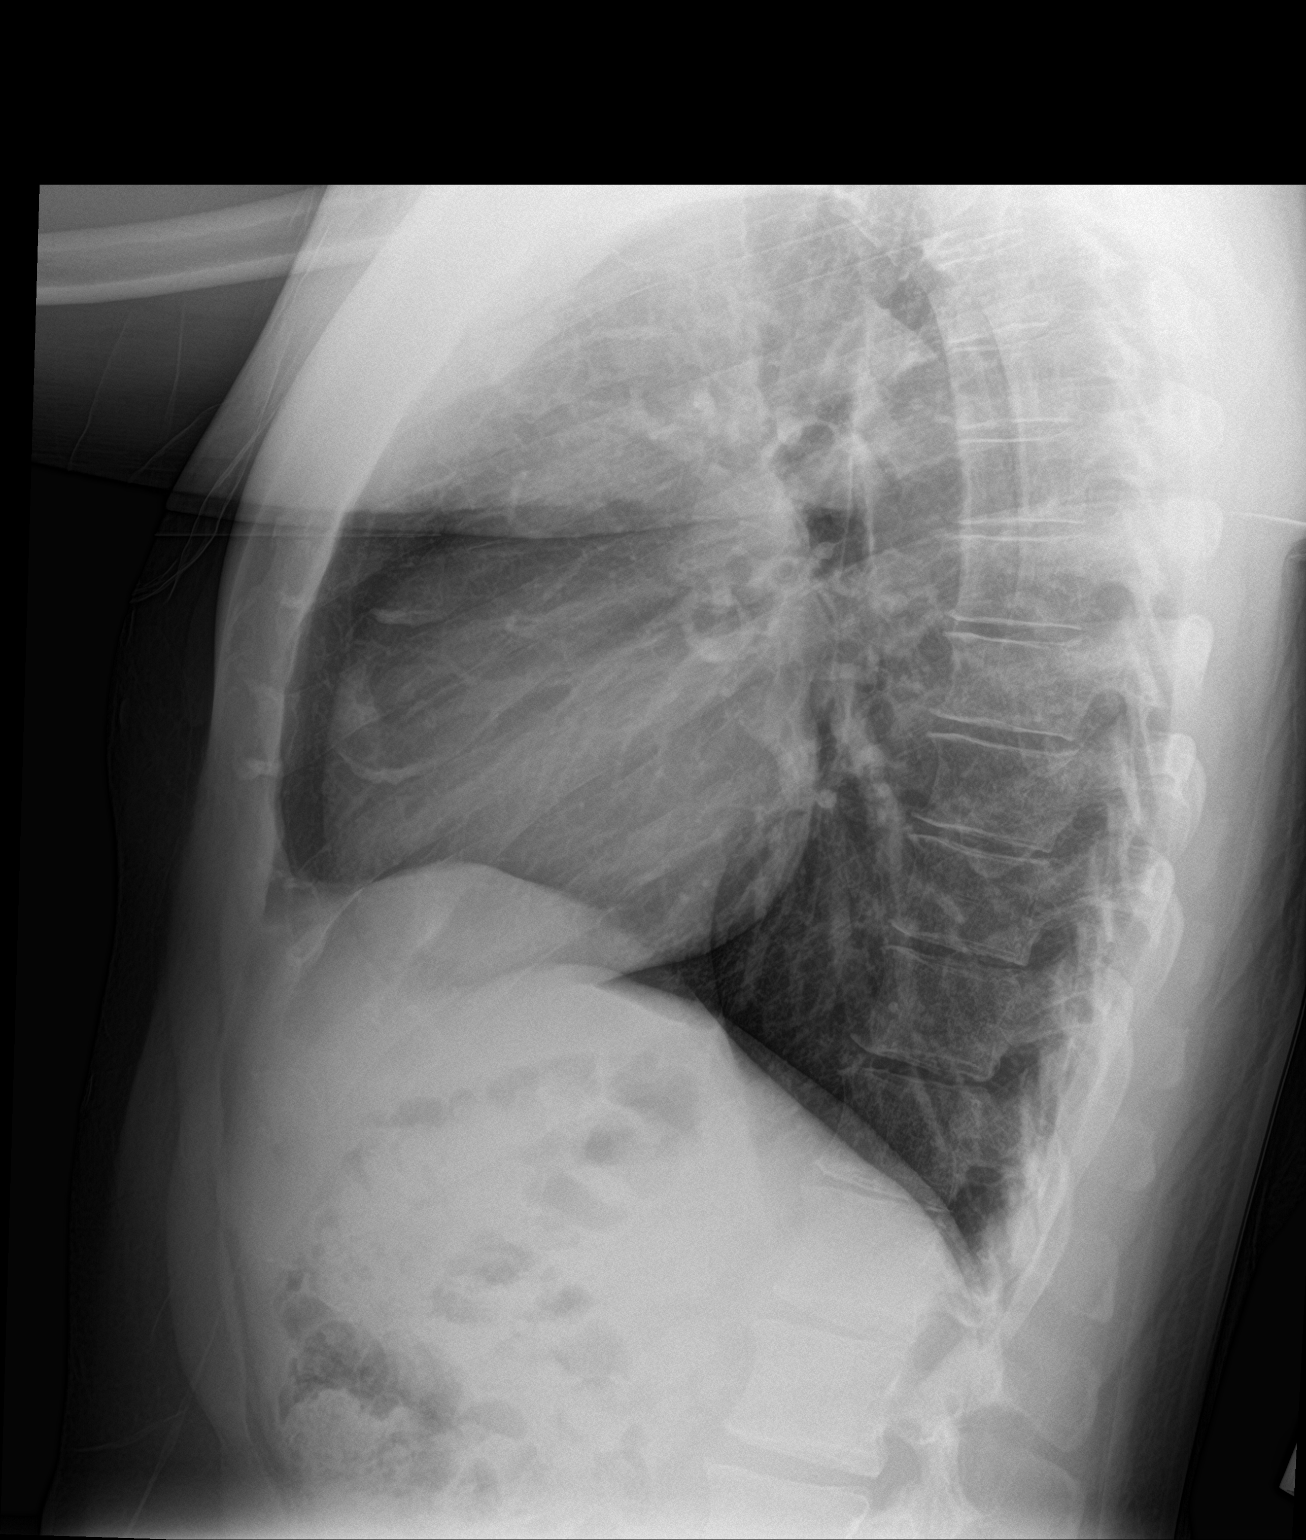

[chest ap]
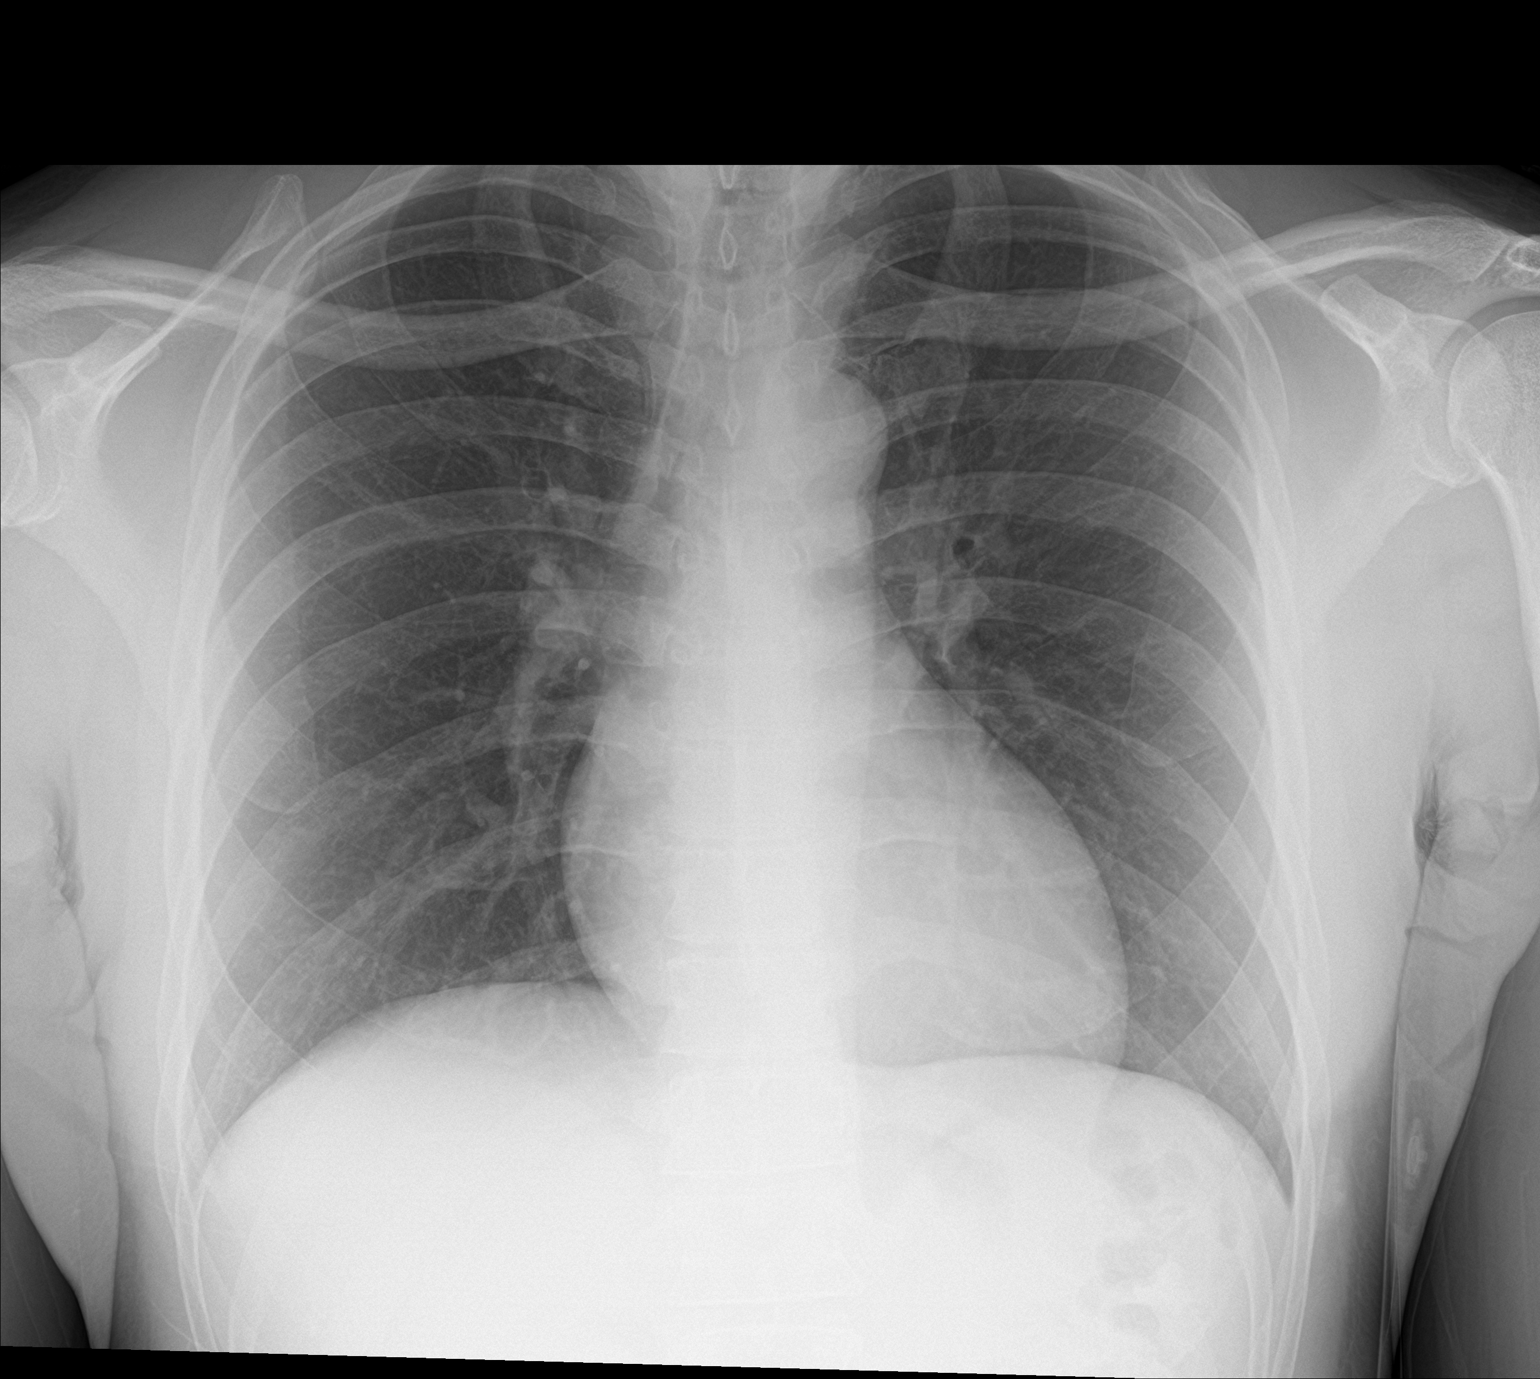

[2 of 2 positions shown; findings below may reference images not displayed]

FINDINGS: Normal mediastinum and cardiac silhouette. Normal pulmonary
vasculature. No evidence of effusion, infiltrate, or pneumothorax.
No acute bony abnormality.
IMPRESSION: Normal chest radiograph

## 2024-02-12 ENCOUNTER — Encounter: Payer: Self-pay | Admitting: *Deleted

## 2024-04-08 DIAGNOSIS — G4733 Obstructive sleep apnea (adult) (pediatric): Secondary | ICD-10-CM | POA: Insufficient documentation
